# Patient Record
Sex: Male | Born: 1965 | Race: Black or African American | Hispanic: No | Marital: Married | Smoking: Current every day smoker
Health system: Southern US, Community
[De-identification: ages and names within clinical notes are randomized; demographics above are authoritative.]

## PROBLEM LIST (undated history)

## (undated) ENCOUNTER — Emergency Department (HOSPITAL_COMMUNITY): Payer: Self-pay

## (undated) DIAGNOSIS — D571 Sickle-cell disease without crisis: Secondary | ICD-10-CM

## (undated) DIAGNOSIS — A159 Respiratory tuberculosis unspecified: Secondary | ICD-10-CM

## (undated) DIAGNOSIS — D573 Sickle-cell trait: Secondary | ICD-10-CM

## (undated) HISTORY — PX: APPENDECTOMY: SHX54

## (undated) HISTORY — PX: MASS EXCISION: SHX2000

## (undated) HISTORY — DX: Sickle-cell disease without crisis: D57.1

## (undated) HISTORY — DX: Respiratory tuberculosis unspecified: A15.9

---

## 2006-03-19 ENCOUNTER — Ambulatory Visit (HOSPITAL_BASED_OUTPATIENT_CLINIC_OR_DEPARTMENT_OTHER): Admission: RE | Admit: 2006-03-19 | Discharge: 2006-03-19 | Payer: Self-pay | Admitting: Orthopedic Surgery

## 2013-09-08 ENCOUNTER — Encounter: Payer: Self-pay | Admitting: Family

## 2013-09-08 ENCOUNTER — Ambulatory Visit (INDEPENDENT_AMBULATORY_CARE_PROVIDER_SITE_OTHER): Payer: 59 | Admitting: Family

## 2013-09-08 ENCOUNTER — Encounter (INDEPENDENT_AMBULATORY_CARE_PROVIDER_SITE_OTHER): Payer: Self-pay

## 2013-09-08 VITALS — BP 123/77 | HR 85 | Temp 98.5°F | Ht 71.75 in | Wt 168.4 lb

## 2013-09-08 DIAGNOSIS — L02224 Furuncle of groin: Secondary | ICD-10-CM

## 2013-09-08 DIAGNOSIS — G930 Cerebral cysts: Secondary | ICD-10-CM

## 2013-09-08 DIAGNOSIS — L0232 Furuncle of buttock: Secondary | ICD-10-CM

## 2013-09-08 DIAGNOSIS — L02229 Furuncle of trunk, unspecified: Secondary | ICD-10-CM

## 2013-09-08 DIAGNOSIS — L02239 Carbuncle of trunk, unspecified: Secondary | ICD-10-CM

## 2013-09-08 DIAGNOSIS — L0233 Carbuncle of buttock: Secondary | ICD-10-CM

## 2013-09-08 MED ORDER — DOXYCYCLINE HYCLATE 100 MG PO TABS: 100.0000 mg | ORAL_TABLET | Freq: Two times a day (BID) | ORAL | Status: DC

## 2013-09-08 NOTE — Patient Instructions (Signed)

## 2013-09-08 NOTE — Progress Notes (Signed)
   Subjective:    Patient ID: Tyler Mora, male    DOB: [birth year], 48 y.o.   MRN: [id number]  HPI Pt presents to the office for cysts on forehead, right ear lobe, right thigh that pt noticed 2 years ago. Pt states the cysts on his face are not painful, but the one on his thigh is intermittently aching 5 out 10. Pt has not taken or tried anything for them.   Pt states he has multiple absesces On lower abdomen, groin, and gluteal folds. States he has had them 10 year on and off. States they come and go. The one on his lower abdomen is draining.   Review of Systems  Constitutional: Negative.   HENT: Negative.   Respiratory: Negative.   Cardiovascular: Negative.   Gastrointestinal: Negative.   Endocrine: Negative.   Genitourinary: Negative.   Musculoskeletal: Negative.   Neurological: Negative.   Hematological: Negative.   Psychiatric/Behavioral: Negative.   All other systems reviewed and are negative.      Objective:   Physical Exam  Vitals reviewed. Constitutional: He is oriented to person, place, and time. He appears well-developed and well-nourished. No distress.  HENT:  Head: Normocephalic.  Right Ear: External ear normal.  Left Ear: External ear normal.  Mouth/Throat: Oropharynx is clear and moist.  Eyes: Pupils are equal, round, and reactive to light. Right eye exhibits no discharge. Left eye exhibits no discharge.  Neck: Normal range of motion. Neck supple. No thyromegaly present.  Cardiovascular: Normal rate, regular rhythm, normal heart sounds and intact distal pulses.   No murmur heard. Pulmonary/Chest: Effort normal and breath sounds normal. No respiratory distress. He has no wheezes.  Abdominal: Soft. Bowel sounds are normal. He exhibits no distension. There is no tenderness.  Musculoskeletal: Normal range of motion. He exhibits no edema and no tenderness.  Neurological: He is alert and oriented to person, place, and time. He has normal reflexes. No cranial nerve  deficit.  Skin: Skin is warm and dry. No rash noted. No erythema.  Psychiatric: He has a normal mood and affect. His behavior is normal. Judgment and thought content normal.   BP 123/77  Pulse 85  Temp(Src) 98.5 F (36.9 C) (Oral)  Ht 5' 11.75" (1.822 m)  Wt 168 lb 6.4 oz (76.386 kg)  BMI 23.01 kg/m2        Assessment & Plan:  1. Cerebral cysts - Ambulatory referral to Dermatology - doxycycline (VIBRA-TABS) 100 MG tablet; Take 1 tablet (100 mg total) by mouth 2 (two) times daily.  Dispense: 20 tablet; Refill: 0  2. Boil of buttock - Ambulatory referral to Dermatology - doxycycline (VIBRA-TABS) 100 MG tablet; Take 1 tablet (100 mg total) by mouth 2 (two) times daily.  Dispense: 20 tablet; Refill: 0  3. Boil, groin - doxycycline (VIBRA-TABS) 100 MG tablet; Take 1 tablet (100 mg total) by mouth 2 (two) times daily.  Dispense: 20 tablet; Refill: 0   Keep skin clean and dry Do not pick at abscesses RTO prn  Jannifer Rodneyhristy Sharlyn Odonnel, FNP

## 2013-10-19 ENCOUNTER — Encounter: Payer: Self-pay | Admitting: Family Medicine

## 2013-10-19 ENCOUNTER — Ambulatory Visit (INDEPENDENT_AMBULATORY_CARE_PROVIDER_SITE_OTHER): Payer: 59 | Admitting: Family Medicine

## 2013-10-19 ENCOUNTER — Encounter (INDEPENDENT_AMBULATORY_CARE_PROVIDER_SITE_OTHER): Payer: Self-pay

## 2013-10-19 VITALS — BP 130/77 | HR 78 | Temp 98.0°F | Ht 71.0 in | Wt 165.6 lb

## 2013-10-19 DIAGNOSIS — N6459 Other signs and symptoms in breast: Secondary | ICD-10-CM

## 2013-10-19 DIAGNOSIS — N6452 Nipple discharge: Secondary | ICD-10-CM

## 2013-10-19 DIAGNOSIS — Z0000 Encounter for general adult medical examination without abnormal findings: Secondary | ICD-10-CM

## 2013-10-19 LAB — POCT CBC
GRANULOCYTE PERCENT: 68 % (ref 37–80)
HEMATOCRIT: 39.4 % — AB (ref 43.5–53.7)
Hemoglobin: 13 g/dL — AB (ref 14.1–18.1)
Lymph, poc: 3.3 (ref 0.6–3.4)
MCH, POC: 32.2 pg — AB (ref 27–31.2)
MCHC: 32.9 g/dL (ref 31.8–35.4)
MCV: 97.8 fL — AB (ref 80–97)
MPV: 8.2 fL (ref 0–99.8)
POC Granulocyte: 7.8 — AB (ref 2–6.9)
POC LYMPH PERCENT: 29.2 %L (ref 10–50)
Platelet Count, POC: 224 10*3/uL (ref 142–424)
RBC: 4 M/uL — AB (ref 4.69–6.13)
RDW, POC: 12.7 %
WBC: 11.4 10*3/uL — AB (ref 4.6–10.2)

## 2013-10-19 NOTE — Progress Notes (Signed)
   Subjective:    Patient ID: Tyler Mora, male    DOB: April 14, 1965, 48 y.o.   MRN: [id number]  HPI 48 year old gentleman who is here for a physical. He has had some problems with recurrent boils and was supposed to have seen a dermatologist but he canceled that appointment. He was placed on antibiotic his last visit here he does not feel that is effective. He also talks about some sort of mass under left breast area for which he had surgery. Problems seemed to resolve but now is recurring there is some discharge from the breast as it enlarges and then with discharge had become smaller.    Review of Systems  Constitutional: Negative.   HENT: Negative.   Eyes: Negative.   Respiratory: Negative.  Negative for shortness of breath.   Cardiovascular: Negative.  Negative for chest pain and leg swelling.  Gastrointestinal: Negative.   Genitourinary: Negative.   Musculoskeletal: Negative.   Skin: Negative.   Neurological: Negative.   Psychiatric/Behavioral: Negative.   All other systems reviewed and are negative.      Objective:   Physical Exam  Constitutional: He is oriented to person, place, and time. He appears well-developed and well-nourished.  HENT:  Head: Normocephalic.  Right Ear: External ear normal.  Left Ear: External ear normal.  Nose: Nose normal.  Mouth/Throat: Oropharynx is clear and moist.  Eyes: Conjunctivae and EOM are normal. Pupils are equal, round, and reactive to light.  Neck: Normal range of motion. Neck supple.  Cardiovascular: Normal rate, regular rhythm, normal heart sounds and intact distal pulses.   Pulmonary/Chest: Effort normal and breath sounds normal.  I can palpate no specific mass under the or behind the left breast and there is no discharge  Abdominal: Soft. Bowel sounds are normal.  Genitourinary: Prostate normal.  Musculoskeletal: Normal range of motion.  Neurological: He is alert and oriented to person, place, and time.  Skin: Skin is warm and  dry.  Several small lumps at the superior aspect of the gluteal fold nontender nondraining that he has described as boils.  Psychiatric: He has a normal mood and affect. His behavior is normal. Judgment and thought content normal.   BP 130/77  Pulse 78  Temp(Src) 98 F (36.7 C) (Oral)  Ht _0  (1.803 m)  Wt 165 lb 9.6 oz (75.116 kg)  BMI 23.11 kg/m2       Assessment & Plan:  1. Routine general medical examination at a health care facility  - POCT CBC - CMP14+EGFR - Lipid panel  2. Breast discharge Send back to surgeon who operated initially - Prolactin - Ambulatory referral to General Surgery  Wardell Honour MD

## 2013-10-19 NOTE — Patient Instructions (Signed)

## 2013-10-20 LAB — CMP14+EGFR
ALBUMIN: 4.4 g/dL (ref 3.5–5.5)
ALK PHOS: 70 IU/L (ref 39–117)
ALT: 15 IU/L (ref 0–44)
AST: 16 IU/L (ref 0–40)
Albumin/Globulin Ratio: 1.4 (ref 1.1–2.5)
BILIRUBIN TOTAL: 0.5 mg/dL (ref 0.0–1.2)
BUN / CREAT RATIO: 10 (ref 9–20)
BUN: 9 mg/dL (ref 6–24)
CO2: 24 mmol/L (ref 18–29)
CREATININE: 0.88 mg/dL (ref 0.76–1.27)
Calcium: 9.4 mg/dL (ref 8.7–10.2)
Chloride: 101 mmol/L (ref 97–108)
GFR calc non Af Amer: 102 mL/min/{1.73_m2} (ref >59)
GFR, EST AFRICAN AMERICAN: 118 mL/min/{1.73_m2} (ref >59)
Globulin, Total: 3.1 g/dL (ref 1.5–4.5)
Glucose: 84 mg/dL (ref 65–99)
Potassium: 4.1 mmol/L (ref 3.5–5.2)
Sodium: 139 mmol/L (ref 134–144)
TOTAL PROTEIN: 7.5 g/dL (ref 6.0–8.5)

## 2013-10-20 LAB — LIPID PANEL
CHOL/HDL RATIO: 3.4 ratio (ref 0.0–5.0)
Cholesterol, Total: 181 mg/dL (ref 100–199)
HDL: 53 mg/dL (ref >39)
LDL Calculated: 104 mg/dL — ABNORMAL HIGH (ref 0–99)
Triglycerides: 122 mg/dL (ref 0–149)
VLDL CHOLESTEROL CAL: 24 mg/dL (ref 5–40)

## 2013-10-20 LAB — PROLACTIN: Prolactin: 2.9 ng/mL — ABNORMAL LOW (ref 4.0–15.2)

## 2013-11-18 ENCOUNTER — Telehealth: Payer: Self-pay | Admitting: Family Medicine

## 2013-11-18 NOTE — Telephone Encounter (Signed)
Re-Informed patient of lab results and Dr. Rondel BatonMiller's notes from 9/16 labs

## 2018-08-19 ENCOUNTER — Emergency Department (HOSPITAL_COMMUNITY)
Admission: EM | Admit: 2018-08-19 | Discharge: 2018-08-19 | Disposition: A | Discharge status: Home / Self Care | Payer: BC Managed Care – PPO | Attending: Emergency Medicine | Admitting: Emergency Medicine

## 2018-08-19 ENCOUNTER — Other Ambulatory Visit: Payer: Self-pay

## 2018-08-19 ENCOUNTER — Encounter (HOSPITAL_COMMUNITY): Payer: Self-pay

## 2018-08-19 DIAGNOSIS — L0291 Cutaneous abscess, unspecified: Secondary | ICD-10-CM | POA: Diagnosis present

## 2018-08-19 DIAGNOSIS — F17200 Nicotine dependence, unspecified, uncomplicated: Secondary | ICD-10-CM | POA: Diagnosis not present

## 2018-08-19 DIAGNOSIS — L02412 Cutaneous abscess of left axilla: Secondary | ICD-10-CM | POA: Diagnosis not present

## 2018-08-19 MED ORDER — SULFAMETHOXAZOLE-TRIMETHOPRIM 800-160 MG PO TABS
1.0000 | ORAL_TABLET | Freq: Once | ORAL | Status: AC
  Administered 2018-08-19: 1 via ORAL
  Filled 2018-08-19: qty 1

## 2018-08-19 MED ORDER — HYDROCODONE-ACETAMINOPHEN 5-325 MG PO TABS: 1.0000 | ORAL_TABLET | ORAL | 0 refills | Status: DC | PRN

## 2018-08-19 MED ORDER — LIDOCAINE HCL (PF) 1 % IJ SOLN
INTRAMUSCULAR | Status: AC
  Administered 2018-08-19: 30 mL
  Filled 2018-08-19: qty 30

## 2018-08-19 MED ORDER — SULFAMETHOXAZOLE-TRIMETHOPRIM 800-160 MG PO TABS: 1.0000 | ORAL_TABLET | Freq: Two times a day (BID) | ORAL | 0 refills | Status: AC

## 2018-08-19 MED ORDER — POVIDONE-IODINE 10 % EX SOLN
CUTANEOUS | Status: AC
  Filled 2018-08-19: qty 15

## 2018-08-19 MED ORDER — HYDROCODONE-ACETAMINOPHEN 5-325 MG PO TABS
1.0000 | ORAL_TABLET | Freq: Once | ORAL | Status: AC
  Administered 2018-08-19: 1 via ORAL
  Filled 2018-08-19: qty 1

## 2018-08-19 NOTE — ED Triage Notes (Signed)
Pt has abscess to left underarm that has been developing for 2 weeks. Has tried warm compresses

## 2018-08-19 NOTE — ED Provider Notes (Signed)
[facility] Provider Note   CSN: [id number] Arrival date & time: 08/19/18  0740     History   Chief Complaint Chief Complaint  Patient presents with  . Abscess    HPI Tyler Mora is a 53 y.o. male.     The history is provided by the patient.  Abscess Location:  Shoulder/arm Shoulder/arm abscess location:  L axilla Abscess quality: fluctuance, painful and redness   Red streaking: no   Duration:  2 weeks Progression:  Worsening Pain details:    Quality:  Sharp and throbbing   Severity:  Severe   Timing:  Constant   Progression:  Worsening Chronicity:  Recurrent Context: not diabetes, not immunosuppression, not injected drug use and not skin injury   Relieved by: He has applied Aspercreme to the site with minimal improvement. Worsened by:  Nothing Ineffective treatments:  Warm compresses Associated symptoms: no fever, no nausea and no vomiting   Risk factors: prior abscess   Risk factors comment:  Has had infrequent abscesses, 1 to left nipple another to the buttocks.   Past Medical History:  Diagnosis Date  . Sickle cell anemia (HCC)    Trait  . Tuberculosis    Pt reports being exposed    There are no active problems to display for this patient.   Past Surgical History:  Procedure Laterality Date  . APPENDECTOMY    . MASS EXCISION Left    Chest area        Home Medications    Prior to Admission medications   Medication Sig Start Date End Date Taking? Authorizing Provider  HYDROcodone-acetaminophen (NORCO/VICODIN) 5-325 MG tablet Take 1 tablet by mouth every 4 (four) hours as needed. 08/19/18   Burgess AmorIdol, Sheryn Aldaz, PA-C  sulfamethoxazole-trimethoprim (BACTRIM DS) 800-160 MG tablet Take 1 tablet by mouth 2 (two) times daily for 10 days. 08/19/18 08/29/18  Burgess AmorIdol, Louise Victory, PA-C    Family History No family history on file.  Social History Social History   Tobacco Use  . Smoking status: Current Every Day Smoker  . Smokeless tobacco:  Never Used  Substance Use Topics  . Alcohol use: Yes  . Drug use: No     Allergies   Iodine and Penicillins   Review of Systems Review of Systems  Constitutional: Negative for chills and fever.  Respiratory: Negative for shortness of breath and wheezing.   Gastrointestinal: Negative for nausea and vomiting.  Skin: Positive for color change.       Negative except as mentioned in HPI.   Neurological: Negative for numbness.     Physical Exam Updated Vital Signs BP 133/78 (BP Location: Right Arm)   Pulse 76   Temp 98.1 F (36.7 C) (Oral)   Resp 14   Ht 6' (1.829 m)   Wt 83 kg   SpO2 99%   BMI 24.82 kg/m   Physical Exam Constitutional:      General: He is not in acute distress.    Appearance: He is well-developed.  HENT:     Head: Normocephalic.  Neck:     Musculoskeletal: Neck supple.  Cardiovascular:     Rate and Rhythm: Normal rate.  Pulmonary:     Effort: Pulmonary effort is normal.     Breath sounds: No wheezing.  Musculoskeletal: Normal range of motion.  Skin:    Findings: Abscess present. No rash.     Comments: Large golf ball sized fluctuant raised abscess mid left axilla.  There is no spontaneous drainage.  No  red streaking or surrounding erythema.      ED Treatments / Results  Labs (all labs ordered are listed, but only abnormal results are displayed) Labs Reviewed  AEROBIC CULTURE (SUPERFICIAL SPECIMEN)    EKG None  Radiology No results found.  Procedures Procedures (including critical care time)  INCISION AND DRAINAGE Performed by: Evalee Jefferson Consent: Verbal consent obtained. Risks and benefits: risks, benefits and alternatives were discussed Type: abscess  Body area: Left axilla  Anesthesia: local infiltration  Incision was made with a scalpel.  Local anesthetic: lidocaine 1 % without epinephrine  Anesthetic total: 4 ml  Complexity: complex Blunt dissection to break up loculations  Drainage: purulent  Drainage  amount: Copious  Packing material: 1/4 in iodoform gauze  Patient tolerance: Patient tolerated the procedure well with no immediate complications.     Medications Ordered in ED Medications  lidocaine (PF) (XYLOCAINE) 1 % injection (has no administration in time range)  sulfamethoxazole-trimethoprim (BACTRIM DS) 800-160 MG per tablet 1 tablet (has no administration in time range)  HYDROcodone-acetaminophen (NORCO/VICODIN) 5-325 MG per tablet 1 tablet (has no administration in time range)     Initial Impression / Assessment and Plan / ED Course  I have reviewed the triage vital signs and the nursing notes.  Pertinent labs & imaging results that were available during my care of the patient were reviewed by me and considered in my medical decision making (see chart for details).        Abscess culture was obtained.  He was placed on Bactrim, hydrocodone for pain.  Discussed home care including warm water soaks, gentle massage, patient felt comfortable removing the packing in 3 days, amount of packing was approximately 3 inches, just enough to keep the abscess site open.  Discussed return precautions.  Final Clinical Impressions(s) / ED Diagnoses   Final diagnoses:  Abscess of left axilla    ED Discharge Orders         Ordered    HYDROcodone-acetaminophen (NORCO/VICODIN) 5-325 MG tablet  Every 4 hours PRN     08/19/18 1009    sulfamethoxazole-trimethoprim (BACTRIM DS) 800-160 MG tablet  2 times daily     08/19/18 1009           Evalee Jefferson, Hershal Coria 08/19/18 1035    Milton Ferguson, MD 08/21/18 [phone]

## 2018-08-19 NOTE — Discharge Instructions (Addendum)
Apply a warm soak or gentle shower massage to your abscess site twice daily as discussed, gently massaging around the site while in the shower can help facilitate continued drainage.  Pull the packing out (if it has not already fallen out) in 3 days.  Take your next dose of the antibiotic tonight.  You may take the hydrocodone prescribed for pain relief.  This will make you drowsy - do not drive within 4 hours of taking this medication.  Get rechecked if you have any problems as this infection continues to heal.

## 2018-08-22 LAB — AEROBIC CULTURE W GRAM STAIN (SUPERFICIAL SPECIMEN): Culture: NO GROWTH

## 2019-03-09 ENCOUNTER — Encounter (HOSPITAL_BASED_OUTPATIENT_CLINIC_OR_DEPARTMENT_OTHER): Payer: Self-pay

## 2019-03-09 ENCOUNTER — Other Ambulatory Visit: Payer: Self-pay

## 2019-03-09 ENCOUNTER — Emergency Department (HOSPITAL_BASED_OUTPATIENT_CLINIC_OR_DEPARTMENT_OTHER)
Admission: EM | Admit: 2019-03-09 | Discharge: 2019-03-09 | Disposition: A | Discharge status: Home / Self Care | Payer: BC Managed Care – PPO | Attending: Emergency Medicine | Admitting: Emergency Medicine

## 2019-03-09 DIAGNOSIS — Z880 Allergy status to penicillin: Secondary | ICD-10-CM | POA: Diagnosis not present

## 2019-03-09 DIAGNOSIS — L02811 Cutaneous abscess of head [any part, except face]: Secondary | ICD-10-CM | POA: Diagnosis not present

## 2019-03-09 DIAGNOSIS — R220 Localized swelling, mass and lump, head: Secondary | ICD-10-CM | POA: Diagnosis present

## 2019-03-09 DIAGNOSIS — Z883 Allergy status to other anti-infective agents status: Secondary | ICD-10-CM | POA: Insufficient documentation

## 2019-03-09 DIAGNOSIS — F17210 Nicotine dependence, cigarettes, uncomplicated: Secondary | ICD-10-CM | POA: Insufficient documentation

## 2019-03-09 HISTORY — DX: Sickle-cell trait: D57.3

## 2019-03-09 MED ORDER — DOXYCYCLINE HYCLATE 100 MG PO TABS
100.0000 mg | ORAL_TABLET | Freq: Once | ORAL | Status: AC
  Administered 2019-03-09: 23:00:00 100 mg via ORAL
  Filled 2019-03-09: qty 1

## 2019-03-09 MED ORDER — DOXYCYCLINE HYCLATE 100 MG PO CAPS: 100.0000 mg | ORAL_CAPSULE | Freq: Two times a day (BID) | ORAL | 0 refills | Status: DC

## 2019-03-09 NOTE — ED Triage Notes (Signed)
Pt c/o "boil or a cyst" to top of head x 2 days-NAD-steady gait

## 2019-03-09 NOTE — ED Provider Notes (Signed)
[facility] Provider Note: Georgena Spurling, MD, FACEP  CSN: [id number] MRN: [id number] ARRIVAL: 03/09/19 at 2244 ROOM: [place]  Abscess   HISTORY OF PRESENT ILLNESS  03/09/19 10:58 PM Tyler Mora is a 54 y.o. male who complains of a boil or cyst on the top of his head for 2 days.  Associated pain is rated as a 9 out of 10, worse with palpation.  He wears a helmet at work and this has been irritating the site.  It has not been draining.   Past Medical History:  Diagnosis Date  . Sickle cell trait ([place])   . Tuberculosis    Pt reports being exposed    Past Surgical History:  Procedure Laterality Date  . APPENDECTOMY    . MASS EXCISION Left    Chest area    No family history on file.  Social History   Tobacco Use  . Smoking status: Current Every Day Smoker  . Smokeless tobacco: Never Used  Substance Use Topics  . Alcohol use: Yes    Comment: occ  . Drug use: No    Prior to Admission medications   Medication Sig Start Date End Date Taking? Authorizing Provider  doxycycline (VIBRAMYCIN) 100 MG capsule Take 1 capsule (100 mg total) by mouth 2 (two) times daily. One po bid x 7 days 03/09/19   Dewaine Morocho, MD    Allergies Iodine and Penicillins   REVIEW OF SYSTEMS  Negative except as noted here or in the History of Present Illness.   PHYSICAL EXAMINATION  Initial Vital Signs Blood pressure (!) 147/101, pulse 87, temperature 98.4 F (36.9 C), temperature source Oral, resp. rate 18, height 6' (1.829 m), weight 78 kg, SpO2 100 %.  Examination General: Well-developed, well-nourished male in no acute distress; appearance consistent with age of record HENT: normocephalic; atraumatic; tender, nonfluctuant nodule right parietal scalp Eyes: Normal appearance Neck: supple Heart: regular rate and rhythm Lungs: clear to auscultation bilaterally Abdomen: soft; nondistended; nontender; bowel sounds present Extremities: No deformity; full  range of motion Neurologic: Awake, alert and oriented; motor function intact in all extremities and symmetric; no facial droop Skin: Warm and dry Psychiatric: Normal mood and affect   RESULTS  Summary of this visit's results, reviewed and interpreted by myself:   EKG Interpretation  Date/Time:    Ventricular Rate:    PR Interval:    QRS Duration:   QT Interval:    QTC Calculation:   R Axis:     Text Interpretation:        Laboratory Studies: No results found for this or any previous visit (from the past 24 hour(s)). Imaging Studies: No results found.  ED COURSE and MDM  Nursing notes, initial and subsequent vitals signs, including pulse oximetry, reviewed and interpreted by myself.  Vitals:   03/09/19 2250  BP: (!) 147/101  Pulse: 87  Resp: 18  Temp: 98.4 F (36.9 C)  TempSrc: Oral  SpO2: 100%  Weight: 78 kg  Height: 6' (1.829 m)   Medications  doxycycline (VIBRA-TABS) tablet 100 mg (has no administration in time range)    No pus pocket seen with bedside ultrasound.  We will trial a course of doxycycline and have the patient return in 2 to 3 days if symptoms are worsening we will consider I&D.  Examination consistent with an early abscess.  It could also represent an inflamed or infected epidermoid cyst but the rapidity of onset suggests against this.  PROCEDURES  Procedures   ED DIAGNOSES     ICD-10-CM   1. Abscess of scalp  L02.811        Paula Libra, MD 03/09/19 2309

## 2019-09-15 ENCOUNTER — Ambulatory Visit: Payer: BC Managed Care – PPO | Attending: Internal Medicine

## 2019-09-15 DIAGNOSIS — Z23 Encounter for immunization: Secondary | ICD-10-CM

## 2019-09-15 NOTE — Progress Notes (Signed)
   Covid-19 Vaccination Clinic  Name:  Tyler Mora    MRN: [id number] DOB: [birth year]  09/15/2019  [name] was observed post Covid-19 immunization for 15 minutes without incident. He was provided with Vaccine Information Sheet and instruction to access the V-Safe system.   [name] was instructed to call 911 with any severe reactions post vaccine: Marland Kitchen Difficulty breathing  . Swelling of face and throat  . A fast heartbeat  . A bad rash all over body  . Dizziness and weakness   Immunizations Administered    Name Date Dose VIS Date Route   Pfizer COVID-19 Vaccine 09/15/2019  3:18 PM 0.3 mL 03/30/2018 Intramuscular   Manufacturer: [employer], [employer]   Lot: [id number]   NDC: [address]-5    .

## 2019-10-06 ENCOUNTER — Ambulatory Visit: Payer: BC Managed Care – PPO | Attending: Internal Medicine

## 2019-10-06 ENCOUNTER — Other Ambulatory Visit: Payer: Self-pay

## 2019-10-06 DIAGNOSIS — Z23 Encounter for immunization: Secondary | ICD-10-CM

## 2019-10-06 NOTE — Progress Notes (Signed)
   Covid-19 Vaccination Clinic  Name:  Mando Blatz    MRN: [id number] DOB: [birth year]  10/06/2019  Mr. Hofacker was observed post Covid-19 immunization for 15 minutes without incident. He was provided with Vaccine Information Sheet and instruction to access the V-Safe system.   Mr. Swiatek was instructed to call 911 with any severe reactions post vaccine: Marland Kitchen Difficulty breathing  . Swelling of face and throat  . A fast heartbeat  . A bad rash all over body  . Dizziness and weakness   Immunizations Administered    Name Date Dose VIS Date Route   Pfizer COVID-19 Vaccine 10/06/2019  3:14 PM 0.3 mL 03/30/2018 Intramuscular   Manufacturer: [employer], [employer]   Lot: [id number]   NDC: [address]-9

## 2021-02-22 ENCOUNTER — Emergency Department (HOSPITAL_COMMUNITY): Payer: 59

## 2021-02-22 ENCOUNTER — Inpatient Hospital Stay (HOSPITAL_COMMUNITY): Payer: 59 | Admitting: Anesthesiology

## 2021-02-22 ENCOUNTER — Inpatient Hospital Stay (HOSPITAL_COMMUNITY): Payer: 59

## 2021-02-22 ENCOUNTER — Encounter (HOSPITAL_COMMUNITY)
Admission: EM | Disposition: A | Discharge status: Home / Self Care | Payer: Self-pay | Source: Home / Self Care | Attending: Neurosurgery

## 2021-02-22 ENCOUNTER — Inpatient Hospital Stay (HOSPITAL_COMMUNITY)
Admission: EM | Admit: 2021-02-22 | Discharge: 2021-02-26 | DRG: 022 | Disposition: A | Discharge status: Home / Self Care | Payer: 59 | Attending: Neurosurgery | Admitting: Neurosurgery

## 2021-02-22 ENCOUNTER — Encounter (HOSPITAL_COMMUNITY): Payer: Self-pay | Admitting: *Deleted

## 2021-02-22 ENCOUNTER — Other Ambulatory Visit: Payer: Self-pay

## 2021-02-22 DIAGNOSIS — Z880 Allergy status to penicillin: Secondary | ICD-10-CM | POA: Diagnosis not present

## 2021-02-22 DIAGNOSIS — I615 Nontraumatic intracerebral hemorrhage, intraventricular: Principal | ICD-10-CM | POA: Diagnosis present

## 2021-02-22 DIAGNOSIS — I609 Nontraumatic subarachnoid hemorrhage, unspecified: Secondary | ICD-10-CM

## 2021-02-22 DIAGNOSIS — I725 Aneurysm of other precerebral arteries: Secondary | ICD-10-CM | POA: Diagnosis present

## 2021-02-22 DIAGNOSIS — R519 Headache, unspecified: Secondary | ICD-10-CM

## 2021-02-22 DIAGNOSIS — F17210 Nicotine dependence, cigarettes, uncomplicated: Secondary | ICD-10-CM | POA: Diagnosis present

## 2021-02-22 DIAGNOSIS — I671 Cerebral aneurysm, nonruptured: Secondary | ICD-10-CM | POA: Diagnosis present

## 2021-02-22 DIAGNOSIS — Z888 Allergy status to other drugs, medicaments and biological substances status: Secondary | ICD-10-CM

## 2021-02-22 DIAGNOSIS — D573 Sickle-cell trait: Secondary | ICD-10-CM | POA: Diagnosis present

## 2021-02-22 DIAGNOSIS — I604 Nontraumatic subarachnoid hemorrhage from basilar artery: Secondary | ICD-10-CM | POA: Diagnosis present

## 2021-02-22 DIAGNOSIS — Z20822 Contact with and (suspected) exposure to covid-19: Secondary | ICD-10-CM | POA: Diagnosis present

## 2021-02-22 HISTORY — PX: RADIOLOGY WITH ANESTHESIA: SHX6223

## 2021-02-22 HISTORY — PX: IR ANGIO INTRA EXTRACRAN SEL INTERNAL CAROTID BILAT MOD SED: IMG5363

## 2021-02-22 HISTORY — PX: IR ANGIOGRAM FOLLOW UP STUDY: IMG697

## 2021-02-22 HISTORY — PX: IR TRANSCATH/EMBOLIZ: IMG695

## 2021-02-22 LAB — DIFFERENTIAL
Abs Immature Granulocytes: 0.06 10*3/uL (ref 0.00–0.07)
Basophils Absolute: 0.1 10*3/uL (ref 0.0–0.1)
Basophils Relative: 0 %
Eosinophils Absolute: 0.4 10*3/uL (ref 0.0–0.5)
Eosinophils Relative: 2 %
Immature Granulocytes: 0 %
Lymphocytes Relative: 23 %
Lymphs Abs: 3.7 10*3/uL (ref 0.7–4.0)
Monocytes Absolute: 1.2 10*3/uL — ABNORMAL HIGH (ref 0.1–1.0)
Monocytes Relative: 7 %
Neutro Abs: 10.9 10*3/uL — ABNORMAL HIGH (ref 1.7–7.7)
Neutrophils Relative %: 68 %

## 2021-02-22 LAB — CBC
HCT: 43 % (ref 39.0–52.0)
Hemoglobin: 14.7 g/dL (ref 13.0–17.0)
MCH: 33.4 pg (ref 26.0–34.0)
MCHC: 34.2 g/dL (ref 30.0–36.0)
MCV: 97.7 fL (ref 80.0–100.0)
Platelets: 264 10*3/uL (ref 150–400)
RBC: 4.4 MIL/uL (ref 4.22–5.81)
RDW: 12.7 % (ref 11.5–15.5)
WBC: 16.4 10*3/uL — ABNORMAL HIGH (ref 4.0–10.5)
nRBC: 0 % (ref 0.0–0.2)

## 2021-02-22 LAB — COMPREHENSIVE METABOLIC PANEL
ALT: 19 U/L (ref 0–44)
AST: 31 U/L (ref 15–41)
Albumin: 4.7 g/dL (ref 3.5–5.0)
Alkaline Phosphatase: 81 U/L (ref 38–126)
Anion gap: 11 (ref 5–15)
BUN: 13 mg/dL (ref 6–20)
CO2: 25 mmol/L (ref 22–32)
Calcium: 9.2 mg/dL (ref 8.9–10.3)
Chloride: 102 mmol/L (ref 98–111)
Creatinine, Ser: 0.93 mg/dL (ref 0.61–1.24)
GFR, Estimated: >60 mL/min (ref >60)
Glucose, Bld: 116 mg/dL — ABNORMAL HIGH (ref 70–99)
Potassium: 4 mmol/L (ref 3.5–5.1)
Sodium: 138 mmol/L (ref 135–145)
Total Bilirubin: 0.6 mg/dL (ref 0.3–1.2)
Total Protein: 8.9 g/dL — ABNORMAL HIGH (ref 6.5–8.1)

## 2021-02-22 LAB — TYPE AND SCREEN
ABO/RH(D): AB POS
Antibody Screen: NEGATIVE

## 2021-02-22 LAB — RAPID URINE DRUG SCREEN, HOSP PERFORMED
Amphetamines: NOT DETECTED
Barbiturates: NOT DETECTED
Benzodiazepines: NOT DETECTED
Cocaine: NOT DETECTED
Opiates: NOT DETECTED
Tetrahydrocannabinol: NOT DETECTED

## 2021-02-22 LAB — URINALYSIS, ROUTINE W REFLEX MICROSCOPIC
Bilirubin Urine: NEGATIVE
Glucose, UA: NEGATIVE mg/dL
Hgb urine dipstick: NEGATIVE
Ketones, ur: NEGATIVE mg/dL
Leukocytes,Ua: NEGATIVE
Nitrite: NEGATIVE
Protein, ur: NEGATIVE mg/dL
Specific Gravity, Urine: 1.031 — ABNORMAL HIGH (ref 1.005–1.030)
pH: 6 (ref 5.0–8.0)

## 2021-02-22 LAB — RESP PANEL BY RT-PCR (FLU A&B, COVID) ARPGX2
Influenza A by PCR: NEGATIVE
Influenza B by PCR: NEGATIVE
SARS Coronavirus 2 by RT PCR: NEGATIVE

## 2021-02-22 LAB — ABO/RH: ABO/RH(D): AB POS

## 2021-02-22 LAB — APTT: aPTT: 23 seconds — ABNORMAL LOW (ref 24–36)

## 2021-02-22 LAB — PROTIME-INR
INR: 0.9 (ref 0.8–1.2)
Prothrombin Time: 11.9 seconds (ref 11.4–15.2)

## 2021-02-22 LAB — TROPONIN I (HIGH SENSITIVITY)
Troponin I (High Sensitivity): 4 ng/L (ref <18)
Troponin I (High Sensitivity): 3 ng/L (ref <18)

## 2021-02-22 LAB — ETHANOL: Alcohol, Ethyl (B): <10 mg/dL (ref <10)

## 2021-02-22 SURGERY — IR WITH ANESTHESIA
Anesthesia: General

## 2021-02-22 MED ORDER — STROKE: EARLY STAGES OF RECOVERY BOOK
Freq: Once | Status: AC
  Filled 2021-02-22: qty 1

## 2021-02-22 MED ORDER — CHLORHEXIDINE GLUCONATE 0.12 % MT SOLN
OROMUCOSAL | Status: AC
  Filled 2021-02-22: qty 15

## 2021-02-22 MED ORDER — MORPHINE SULFATE (PF) 2 MG/ML IV SOLN: 1.0000 mg | INTRAVENOUS | Status: DC | PRN

## 2021-02-22 MED ORDER — METOCLOPRAMIDE HCL 5 MG/ML IJ SOLN
10.0000 mg | Freq: Once | INTRAMUSCULAR | Status: AC
  Administered 2021-02-22: 10 mg via INTRAVENOUS
  Filled 2021-02-22: qty 2

## 2021-02-22 MED ORDER — ONDANSETRON HCL 4 MG/2ML IJ SOLN
INTRAMUSCULAR | Status: DC | PRN
Start: 2021-02-22 — End: 2021-02-22
  Administered 2021-02-22: 4 mg via INTRAVENOUS

## 2021-02-22 MED ORDER — ORAL CARE MOUTH RINSE: 15.0000 mL | Freq: Once | OROMUCOSAL | Status: DC

## 2021-02-22 MED ORDER — SODIUM CHLORIDE 0.9 % IV SOLN: INTRAVENOUS | Status: DC

## 2021-02-22 MED ORDER — CLEVIDIPINE BUTYRATE 0.5 MG/ML IV EMUL
0.0000 mg/h | INTRAVENOUS | Status: DC
  Administered 2021-02-22: 2 mg/h via INTRAVENOUS
  Filled 2021-02-22: qty 50

## 2021-02-22 MED ORDER — SUGAMMADEX SODIUM 200 MG/2ML IV SOLN
INTRAVENOUS | Status: DC | PRN
  Administered 2021-02-22: 300 mg via INTRAVENOUS

## 2021-02-22 MED ORDER — NIMODIPINE 6 MG/ML PO SOLN
60.0000 mg | ORAL | Status: DC
  Administered 2021-02-25: 60 mg
  Filled 2021-02-22: qty 10

## 2021-02-22 MED ORDER — CLEVIDIPINE BUTYRATE 0.5 MG/ML IV EMUL
0.0000 mg/h | INTRAVENOUS | Status: DC
  Administered 2021-02-22: 2 mg/h via INTRAVENOUS
  Filled 2021-02-22 (×2): qty 50

## 2021-02-22 MED ORDER — ACETAMINOPHEN 160 MG/5ML PO SOLN: 650.0000 mg | ORAL | Status: DC | PRN

## 2021-02-22 MED ORDER — ACETAMINOPHEN 650 MG RE SUPP: 650.0000 mg | RECTAL | Status: DC | PRN

## 2021-02-22 MED ORDER — DIPHENHYDRAMINE HCL 50 MG/ML IJ SOLN
25.0000 mg | Freq: Once | INTRAMUSCULAR | Status: AC
  Administered 2021-02-22: 25 mg via INTRAVENOUS
  Filled 2021-02-22: qty 1

## 2021-02-22 MED ORDER — IOHEXOL 350 MG/ML SOLN
75.0000 mL | Freq: Once | INTRAVENOUS | Status: AC | PRN
  Administered 2021-02-22: 75 mL via INTRAVENOUS

## 2021-02-22 MED ORDER — FENTANYL CITRATE (PF) 100 MCG/2ML IJ SOLN
INTRAMUSCULAR | Status: AC
  Filled 2021-02-22: qty 2

## 2021-02-22 MED ORDER — LACTATED RINGERS IV SOLN: INTRAVENOUS | Status: DC

## 2021-02-22 MED ORDER — SODIUM CHLORIDE 0.9 % IV SOLN
INTRAVENOUS | Status: DC | PRN
Start: 2021-02-22 — End: 2021-02-22

## 2021-02-22 MED ORDER — EPHEDRINE SULFATE (PRESSORS) 50 MG/ML IJ SOLN
INTRAMUSCULAR | Status: DC | PRN
Start: 2021-02-22 — End: 2021-02-22
  Administered 2021-02-22 (×2): 5 mg via INTRAVENOUS

## 2021-02-22 MED ORDER — PHENYLEPHRINE HCL-NACL 20-0.9 MG/250ML-% IV SOLN
INTRAVENOUS | Status: DC | PRN
  Administered 2021-02-22: 30 ug/min via INTRAVENOUS

## 2021-02-22 MED ORDER — FENTANYL CITRATE (PF) 250 MCG/5ML IJ SOLN
INTRAMUSCULAR | Status: DC | PRN
  Administered 2021-02-22: 100 ug via INTRAVENOUS

## 2021-02-22 MED ORDER — CHLORHEXIDINE GLUCONATE 0.12 % MT SOLN: 15.0000 mL | Freq: Once | OROMUCOSAL | Status: DC

## 2021-02-22 MED ORDER — LABETALOL HCL 5 MG/ML IV SOLN: 20.0000 mg | Freq: Once | INTRAVENOUS | Status: DC

## 2021-02-22 MED ORDER — ONDANSETRON 4 MG PO TBDP: 4.0000 mg | ORAL_TABLET | Freq: Four times a day (QID) | ORAL | Status: DC | PRN

## 2021-02-22 MED ORDER — ONDANSETRON HCL 4 MG/2ML IJ SOLN: 4.0000 mg | Freq: Four times a day (QID) | INTRAMUSCULAR | Status: DC | PRN

## 2021-02-22 MED ORDER — PANTOPRAZOLE 2 MG/ML SUSPENSION
40.0000 mg | Freq: Every day | ORAL | Status: DC
  Filled 2021-02-22: qty 20

## 2021-02-22 MED ORDER — GADOBUTROL 1 MMOL/ML IV SOLN
8.0000 mL | Freq: Once | INTRAVENOUS | Status: AC | PRN
  Administered 2021-02-22: 8 mL via INTRAVENOUS

## 2021-02-22 MED ORDER — CHLORHEXIDINE GLUCONATE CLOTH 2 % EX PADS
6.0000 | MEDICATED_PAD | Freq: Every day | CUTANEOUS | Status: DC
  Administered 2021-02-23 – 2021-02-26 (×3): 6 via TOPICAL

## 2021-02-22 MED ORDER — HYDROMORPHONE HCL 1 MG/ML IJ SOLN
1.0000 mg | Freq: Once | INTRAMUSCULAR | Status: AC
  Administered 2021-02-22: 1 mg via INTRAVENOUS
  Filled 2021-02-22: qty 1

## 2021-02-22 MED ORDER — LIDOCAINE 2% (20 MG/ML) 5 ML SYRINGE
INTRAMUSCULAR | Status: DC | PRN
  Administered 2021-02-22: 60 mg via INTRAVENOUS

## 2021-02-22 MED ORDER — LABETALOL HCL 5 MG/ML IV SOLN
20.0000 mg | Freq: Once | INTRAVENOUS | Status: AC
  Administered 2021-02-22: 20 mg via INTRAVENOUS
  Filled 2021-02-22: qty 4

## 2021-02-22 MED ORDER — DOCUSATE SODIUM 100 MG PO CAPS
100.0000 mg | ORAL_CAPSULE | Freq: Two times a day (BID) | ORAL | Status: DC
  Administered 2021-02-23 – 2021-02-26 (×2): 100 mg via ORAL
  Filled 2021-02-22 (×6): qty 1

## 2021-02-22 MED ORDER — NIMODIPINE 30 MG PO CAPS
60.0000 mg | ORAL_CAPSULE | ORAL | Status: DC
  Administered 2021-02-23 – 2021-02-26 (×22): 60 mg via ORAL
  Filled 2021-02-22 (×23): qty 2

## 2021-02-22 MED ORDER — ROCURONIUM BROMIDE 10 MG/ML (PF) SYRINGE
PREFILLED_SYRINGE | INTRAVENOUS | Status: DC | PRN
Start: 2021-02-22 — End: 2021-02-22
  Administered 2021-02-22: 60 mg via INTRAVENOUS
  Administered 2021-02-22: 40 mg via INTRAVENOUS

## 2021-02-22 MED ORDER — SODIUM CHLORIDE 0.9 % IV BOLUS
1000.0000 mL | Freq: Once | INTRAVENOUS | Status: AC
  Administered 2021-02-22: 1000 mL via INTRAVENOUS

## 2021-02-22 MED ORDER — DEXAMETHASONE SODIUM PHOSPHATE 10 MG/ML IJ SOLN
10.0000 mg | Freq: Once | INTRAMUSCULAR | Status: AC
  Administered 2021-02-22: 10 mg via INTRAVENOUS
  Filled 2021-02-22: qty 1

## 2021-02-22 MED ORDER — ACETAMINOPHEN-CODEINE #3 300-30 MG PO TABS
1.0000 | ORAL_TABLET | ORAL | Status: DC | PRN
  Administered 2021-02-24: 1 via ORAL
  Administered 2021-02-26: 07:00:00 2 via ORAL
  Filled 2021-02-22: qty 1
  Filled 2021-02-22: qty 2
  Filled 2021-02-22: qty 1

## 2021-02-22 MED ORDER — ACETAMINOPHEN 325 MG PO TABS
650.0000 mg | ORAL_TABLET | ORAL | Status: DC | PRN
  Administered 2021-02-25: 650 mg via ORAL
  Filled 2021-02-22 (×2): qty 2

## 2021-02-22 MED ORDER — LIDOCAINE HCL 1 % IJ SOLN
INTRAMUSCULAR | Status: AC
  Filled 2021-02-22: qty 20

## 2021-02-22 MED ORDER — PANTOPRAZOLE SODIUM 40 MG PO TBEC
40.0000 mg | DELAYED_RELEASE_TABLET | Freq: Every day | ORAL | Status: DC
  Administered 2021-02-23 – 2021-02-26 (×5): 40 mg via ORAL
  Filled 2021-02-22 (×5): qty 1

## 2021-02-22 MED ORDER — PROPOFOL 10 MG/ML IV BOLUS
INTRAVENOUS | Status: DC | PRN
  Administered 2021-02-22: 150 mg via INTRAVENOUS

## 2021-02-22 NOTE — ED Provider Notes (Addendum)
[facility] EMERGENCY DEPARTMENT Provider Note   CSN: [id number] Arrival date & time: 02/22/21  8676     History  Chief Complaint  Patient presents with   Headache    Tyler Mora is a 56 y.o. male.  Patient feeling fine yesterday.  Patient felt fine when he got awake this morning.  Then at 0 745 severe onset of neck pain that quickly moved up into his head.  Now his head hurts all over.  Neck now feels better.  Associated with nausea and vomiting x1 had some vision changes basically had vision loss in both eyes initially that lasted just for short period of time now has resolved.  Does have some photophobia.  Patient denies any respiratory symptoms any chest pain any shortness of breath any abdominal pain.  No upper extremity or lower extremity weakness or numbness.  And no trouble speaking.  In addition no back pain.  Patient does state that this happened 1 time before.  But he did not seek medical attention.  And it resolved on its own.  No history of migraines no family history of migraines.  Patient's past medical history is significant for no prior history of hypertension.  And a history of sickle cell trait.  Patient is an every day smoker.      Home Medications Prior to Admission medications   Medication Sig Start Date End Date Taking? Authorizing Provider  doxycycline (VIBRAMYCIN) 100 MG capsule Take 1 capsule (100 mg total) by mouth 2 (two) times daily. One po bid x 7 days Patient not taking: Reported on 02/22/2021 03/09/19   Molpus, John, MD      Allergies    Iodine and Penicillins    Review of Systems   Review of Systems  Constitutional:  Negative for chills and fever.  HENT:  Negative for ear pain and sore throat.   Eyes:  Positive for photophobia and visual disturbance. Negative for pain.  Respiratory:  Negative for cough and shortness of breath.   Cardiovascular:  Negative for chest pain and palpitations.  Gastrointestinal:  Negative for abdominal pain and  vomiting.  Genitourinary:  Negative for dysuria and hematuria.  Musculoskeletal:  Positive for neck pain. Negative for arthralgias and back pain.  Skin:  Negative for color change and rash.  Neurological:  Positive for headaches. Negative for seizures and syncope.  All other systems reviewed and are negative.  Physical Exam Updated Vital Signs BP (!) 183/98    Pulse 73    Temp 97.8 F (36.6 C) (Oral)    Resp (!) 23    Ht 1.829 m (6')    Wt 81.6 kg    SpO2 100%    BMI 24.41 kg/m  Physical Exam Vitals and nursing note reviewed.  Constitutional:      General: He is not in acute distress.    Appearance: Normal appearance. He is well-developed.  HENT:     Head: Normocephalic and atraumatic.  Eyes:     Extraocular Movements: Extraocular movements intact.     Conjunctiva/sclera: Conjunctivae normal.     Pupils: Pupils are equal, round, and reactive to light.  Cardiovascular:     Rate and Rhythm: Normal rate and regular rhythm.     Heart sounds: No murmur heard. Pulmonary:     Effort: Pulmonary effort is normal. No respiratory distress.     Breath sounds: Normal breath sounds.  Abdominal:     Palpations: Abdomen is soft.     Tenderness: There is no  abdominal tenderness.  Musculoskeletal:        General: No swelling.     Cervical back: Normal range of motion and neck supple. No rigidity.  Skin:    General: Skin is warm and dry.     Capillary Refill: Capillary refill takes less than 2 seconds.  Neurological:     General: No focal deficit present.     Mental Status: He is alert and oriented to person, place, and time.     Cranial Nerves: No cranial nerve deficit.     Sensory: No sensory deficit.     Motor: No weakness.     Coordination: Coordination normal.  Psychiatric:        Mood and Affect: Mood normal.    ED Results / Procedures / Treatments   Labs (all labs ordered are listed, but only abnormal results are displayed) Labs Reviewed  RESP PANEL BY RT-PCR (FLU A&B, COVID)  ARPGX2  ETHANOL  PROTIME-INR  APTT  CBC  DIFFERENTIAL  COMPREHENSIVE METABOLIC PANEL  RAPID URINE DRUG SCREEN, HOSP PERFORMED  URINALYSIS, ROUTINE W REFLEX MICROSCOPIC  I-STAT CHEM 8, ED    EKG None  Radiology No results found.  Procedures Procedures  Patient's blood pressure is elevated.  184/95 when he arrived.  Thinking this may be secondary to pain will reassess that as his migraine cocktail and pain medicine kicks in to see if that improves.  Medications Ordered in ED Medications  0.9 %  sodium chloride infusion (has no administration in time range)  sodium chloride 0.9 % bolus 1,000 mL (1,000 mLs Intravenous New Bag/Given 02/22/21 0959)  HYDROmorphone (DILAUDID) injection 1 mg (1 mg Intravenous Given 02/22/21 1001)  dexamethasone (DECADRON) injection 10 mg (10 mg Intravenous Given 02/22/21 1000)  metoCLOPramide (REGLAN) injection 10 mg (10 mg Intravenous Given 02/22/21 1000)  diphenhydrAMINE (BENADRYL) injection 25 mg (25 mg Intravenous Given 02/22/21 1000)    ED Course/ Medical Decision Making/ A&P                           Medical Decision Making Amount and/or Complexity of Data Reviewed Labs: ordered. Radiology: ordered.  Risk Prescription drug management. Decision regarding hospitalization.   CRITICAL CARE Performed by: Vanetta MuldersScott Janssen Zee Total critical care time: 95 minutes Critical care time was exclusive of separately billable procedures and treating other patients. Critical care was necessary to treat or prevent imminent or life-threatening deterioration. Critical care was time spent personally by me on the following activities: development of treatment plan with patient and/or surrogate as well as nursing, discussions with consultants, evaluation of patient's response to treatment, examination of patient, obtaining history from patient or surrogate, ordering and performing treatments and interventions, ordering and review of laboratory studies, ordering and  review of radiographic studies, pulse oximetry and re-evaluation of patient's condition.   Patient with acute onset of severe headache that actually started in the neck and then quickly went throughout all of the head.  Did have some temporary blindness in both eyes which has resolved.  Does have some photophobia.  Does have nausea and 1 episode of vomiting.  All suggestive of migraine-like headache but he does not have a history of migraines.  No neurodeficit.  Neck is no longer painful.  May be just a little bit of ache to it.  Will check labs.  Head CT will get CT neck.  Will give migraine cocktail to include Reglan Decadron Benadryl IV and IV fluids.  And also  will give 1 mg of hydromorphone.  Will reassess.  And will reassess his blood pressure.  Blood pressure currently is 172/88.  Patient states headache is improving but not resolved.  Received a call from radiology about the head CT.  They are seeing a hyperdense the within the third ventricle this may represent acute hemorrhage atypical shape for colloid cyst also possible.  There is early hydrocephalus.  Will discuss these findings with the telemetry neurologist.  Patient may need acute blood pressure control.  Discussed with neurology.  They agree with the MRI with and without to further delineate the findings in the third ventricle.  They are not convinced its blood.  They also do agree with blood pressure control to keep his systolic blood pressures below 150.  Currently patient's [id number] systolic.  Have ordered labetalol 20 mg IV.  Then to be followed up with the Cleviprex infusion.  MRI has been ordered.    Final Clinical Impression(s) / ED Diagnoses Final diagnoses:  Bad headache    Rx / DC Orders ED Discharge Orders     None         Fredia Sorrow, MD 02/22/21 1005    Fredia Sorrow, MD 02/22/21 1129    Fredia Sorrow, MD 02/22/21 1151  Addendum: Patient does show evidence of aneurysmal bleed in the third  ventricle area.  Also reviewed by the telemetry neurologist.  I have a call into neurosurgery.  Patient's blood pressures are better controlled on the Cleviprex.  Discussed with Dr. Christella Noa on for neurosurgery.  He would prefer proof of an aneurysm.  So order CT angio head and neck.  And that we will determine if there is any aneurysm that will be neurosurgery admission if there is not just subarachnoid hemorrhage then it would be the neuro hospitalist admission.  Patient will require ICU admission per tickly since he is on the Cleviprex drip.      Fredia Sorrow, MD 02/22/21 1334  Patient clinically still doing quite well.  Neurosurgery wants proof of the aneurysm if there is aneurysm then they will admit to ICU.  If that shows a subarachnoid bleed then patient will need to be admitted by the neurology service to ICU.  I have ordered CT angio head and neck.  Patient understands the reasons behind the additional studies.      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  We will recontact neurosurgery.  Patient remained stable.  Blood pressures currently [id number] systolic.  Patient overall feeling better.  Suspect patient will be an admission to neurosurgical service and an ICU admission at [facility].    Fredia Sorrow, MD 02/22/21 [phone]

## 2021-02-22 NOTE — Anesthesia Preprocedure Evaluation (Addendum)
Anesthesia Evaluation  Patient identified by MRN, date of birth, ID band Patient awake    Reviewed: Allergy & Precautions, Patient's Chart, lab work & pertinent test results  Airway Mallampati: II  TM Distance: >3 FB Neck ROM: Full    Dental  (+) Poor Dentition, Missing   Pulmonary Current Smoker,    Pulmonary exam normal        Cardiovascular negative cardio ROS   Rhythm:Regular Rate:Normal     Neuro/Psych SAH/Basilar aneurysm negative psych ROS   GI/Hepatic negative GI ROS, Neg liver ROS,   Endo/Other  negative endocrine ROS  Renal/GU negative Renal ROS  negative genitourinary   Musculoskeletal negative musculoskeletal ROS (+)   Abdominal Normal abdominal exam  (+)   Peds  Hematology negative hematology ROS (+) Sickle cell trait ,   Anesthesia Other Findings   Reproductive/Obstetrics                            Anesthesia Physical Anesthesia Plan  ASA: 4 and emergent  Anesthesia Plan: General   Post-op Pain Management: Minimal or no pain anticipated   Induction:   PONV Risk Score and Plan: Ondansetron, Dexamethasone, Treatment may vary due to age or medical condition and Midazolam  Airway Management Planned: Oral ETT and Mask  Additional Equipment: Arterial line  Intra-op Plan:   Post-operative Plan: Possible Post-op intubation/ventilation  Informed Consent: I have reviewed the patients History and Physical, chart, labs and discussed the procedure including the risks, benefits and alternatives for the proposed anesthesia with the patient or authorized representative who has indicated his/her understanding and acceptance.     Dental advisory given  Plan Discussed with: Anesthesiologist and CRNA  Anesthesia Plan Comments: (Lab Results      Component                Value               Date                      WBC                      16.4 (H)            02/22/2021                 HGB                      14.7                02/22/2021                HCT                      43.0                02/22/2021                MCV                      97.7                02/22/2021                PLT                      264  02/22/2021           Lab Results      Component                Value               Date                      NA                       138                 02/22/2021                K                        4.0                 02/22/2021                CO2                      25                  02/22/2021                GLUCOSE                  116 (H)             02/22/2021                BUN                      13                  02/22/2021                CREATININE               0.93                02/22/2021                CALCIUM                  9.2                 02/22/2021                GFRNONAA                 >60                 02/22/2021          )       Anesthesia Quick Evaluation

## 2021-02-22 NOTE — ED Notes (Signed)
Patient transported to CT 

## 2021-02-22 NOTE — ED Triage Notes (Signed)
Pt brought in by [facility] from home with c/o sudden onset of headache at 0745 this morning at the base of his head radiating around to the front. Pt had temporary loss of vision, became dizzy and fall, but did not have LOC or hit his head. Pt reports his vision is back to normal now. EMS reports BP of 200/104, HR 86. No medical history and takes no medications.

## 2021-02-22 NOTE — ED Notes (Signed)
Cleviprex drip initiated due to blood pressure 159/98

## 2021-02-22 NOTE — Transfer of Care (Signed)
Immediate Anesthesia Transfer of Care Note  Patient: Tyler Mora  Procedure(s) Performed: IR WITH ANESTHESIA  Patient Location: PACU  Anesthesia Type:General  Level of Consciousness: awake and drowsy  Airway & Oxygen Therapy: Patient Spontanous Breathing and Patient connected to face mask oxygen  Post-op Assessment: Report given to RN and Post -op Vital signs reviewed and stable  Post vital signs: Reviewed and stable  Last Vitals:  Vitals Value Taken Time  BP 120/70 02/22/21 2142  Temp    Pulse    Resp 14 02/22/21 2143  SpO2    Vitals shown include unvalidated device data.  Last Pain:  Vitals:   02/22/21 1645  TempSrc:   PainSc: Asleep         Complications: No notable events documented.

## 2021-02-22 NOTE — ED Notes (Signed)
Dr. Deretha Emory notified of pt's sudden onset of headache.

## 2021-02-22 NOTE — H&P (Addendum)
Chief Complaint   Chief Complaint  Patient presents with   Headache    History of Present Illness  Tyler Mora is a 56 y.o. male initially presenting to [facility] after sudden onset of severe headache earlier in the day.  Patient says he was watching TV and had sudden onset of primarily posterior headache and neck pain.  He walked over to his kitchen and thinks he actually blacked out for a moment.  He therefore called EMS and was taken to [facility] where CT scan revealed a small amount of ventricular hemorrhage.  He underwent further work-up including MRI scan confirming the small amount of intraventricular hemorrhage and the possibility of a basilar aneurysm.  Patient further underwent CT angiogram confirming the presence of the basilar aneurysm and he was transferred to [facility] for further care.  Currently, the patient is not complaining of any further headache.  No changes in his vision.  No numbness tingling or weakness of the extremities.  Of note, patient denies any history of hypertension, diabetes, heart disease, heart attack, or stroke.  No history of lung, liver, or kidney disease.  Patient does have a history of sickle cell trait.  He is not on any blood thinners or antiplatelet agents.  He is a smoker since the age of 74.  There is no known family history of intracranial aneurysms or unexplained intracranial hemorrhage.  Past Medical History   Past Medical History:  Diagnosis Date   Sickle cell trait (HCC)    Tuberculosis    Pt reports being exposed    Past Surgical History   Past Surgical History:  Procedure Laterality Date   APPENDECTOMY     MASS EXCISION Left    Chest area    Social History   Social History   Tobacco Use   Smoking status: Every Day    Packs/day: 0.25    Types: Cigarettes   Smokeless tobacco: Never  Vaping Use   Vaping Use: Never used  Substance Use Topics   Alcohol use: Yes    Comment: occ   Drug use: No     Medications   Prior to Admission medications   Medication Sig Start Date End Date Taking? Authorizing Provider  doxycycline (VIBRAMYCIN) 100 MG capsule Take 1 capsule (100 mg total) by mouth 2 (two) times daily. One po bid x 7 days Patient not taking: Reported on 02/22/2021 03/09/19   Molpus, John, MD    Allergies   Allergies  Allergen Reactions   Iodine Hives   Penicillins Hives    Review of Systems  ROS  Neurologic Exam  Awake, alert, oriented Memory and concentration grossly intact Speech fluent, appropriate CN grossly intact Motor exam: Upper Extremities Deltoid Bicep Tricep Grip  Right 5/5 5/5 5/5 5/5  Left 5/5 5/5 5/5 5/5   Lower Extremities IP Quad PF DF EHL  Right 5/5 5/5 5/5 5/5 5/5  Left 5/5 5/5 5/5 5/5 5/5   Sensation grossly intact to LT  Imaging  CT scan of the head was personally reviewed and demonstrates hemorrhage in the superior third ventricle at the level of the foramen of [place].  There is no identifiable subarachnoid hemorrhage.  MRI was also personally reviewed and again confirms the presence of third ventricular hemorrhage.  No subarachnoid hemorrhage.  There is suggestion of a basilar apex aneurysm on T2 weighted images.  CT angiogram was also reviewed and demonstrates a superiorly projecting aneurysm of the basilar apex measuring approximately 5 mm.  Vertebral arteries appear to be codominant.  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  Impression  - 56 y.o. 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  We have discussed the plan above including the details of the angiogram and coiling procedures.  We have also reviewed  the risks of the procedure to them include primarily stroke or further hemorrhage which could potentially lead to weakness, paralysis, coma, death.  We also reviewed risks of arterial dissection, contrast reaction, nephropathy, or groin hematoma.  We discussed the expected postoperative course and hospital stay associated with ruptured aneurysm.  All their questions were answered.  Both the patient and his wife are in agreement to proceed and [name] provided informed consent .  Excell Seltzer, MD [facility]

## 2021-02-22 NOTE — Brief Op Note (Signed)
°  NEUROSURGERY BRIEF OPERATIVE  NOTE   PREOP DX: Ruptured cerebral aneurysm  POSTOP DX: Same  PROCEDURE: Diagnostic cerebral angiogram  SURGEON: Dr. Consuella Lose, MD  ANESTHESIA: GETA  EBL: Minimal  SPECIMENS: None  COMPLICATIONS: None  CONDITION: Stable to recovery  FINDINGS (Full report in [facility]): 1. Successful coil embolization of a basilar apex aneurysm, There is residual aneurysm incorporating the origin of the left PCA. 2. 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

## 2021-02-22 NOTE — Anesthesia Procedure Notes (Signed)
Procedure Name: Intubation Date/Time: 02/22/2021 8:04 PM Performed by: Claudina Lick, CRNA Pre-anesthesia Checklist: Patient identified, Emergency Drugs available, Suction available and Patient being monitored Patient Re-evaluated:Patient Re-evaluated prior to induction Oxygen Delivery Method: Circle system utilized Preoxygenation: Pre-oxygenation with 100% oxygen Induction Type: IV induction Ventilation: Mask ventilation without difficulty Laryngoscope Size: Glidescope and 4 Grade View: Grade I Tube type: Oral Number of attempts: 2 (DL x 1 by Reinaldo Raddle with miller 2- esophageal intubation. Easy mask resumed. DL x 1 by CRNA with Glidescope- grade 1 view. Easy placement of ETT. Teeth as preop.) Airway Equipment and Method: Stylet and Oral airway Placement Confirmation: ETT inserted through vocal cords under direct vision, positive ETCO2 and breath sounds checked- equal and bilateral Tube secured with: Tape Dental Injury: Teeth and Oropharynx as per pre-operative assessment

## 2021-02-22 NOTE — Anesthesia Procedure Notes (Signed)
Arterial Line Insertion Start/End1/20/2023 6:15 PM, 02/22/2021 6:45 PM Performed by: Dairl Ponder, CRNA, CRNA  Patient location: Pre-op. Preanesthetic checklist: patient identified, IV checked, site marked, risks and benefits discussed, surgical consent, monitors and equipment checked, pre-op evaluation, timeout performed and anesthesia consent Lidocaine 1% used for infiltration Left, radial was placed Catheter size: 20 G Hand hygiene performed  and maximum sterile barriers used   Attempts: 1 Procedure performed without using ultrasound guided technique. Ultrasound Notes:anatomy identified, needle tip was noted to be adjacent to the nerve/plexus identified and no ultrasound evidence of intravascular and/or intraneural injection Following insertion, dressing applied and Biopatch. Post procedure assessment: normal and unchanged  Patient tolerated the procedure well with no immediate complications.

## 2021-02-23 ENCOUNTER — Inpatient Hospital Stay (HOSPITAL_COMMUNITY): Payer: 59

## 2021-02-23 DIAGNOSIS — I609 Nontraumatic subarachnoid hemorrhage, unspecified: Secondary | ICD-10-CM

## 2021-02-23 LAB — HIV ANTIBODY (ROUTINE TESTING W REFLEX): HIV Screen 4th Generation wRfx: NONREACTIVE

## 2021-02-23 LAB — MRSA NEXT GEN BY PCR, NASAL: MRSA by PCR Next Gen: NOT DETECTED

## 2021-02-23 NOTE — Progress Notes (Signed)
Patient ID: Tyler Mora, male   DOB: 1965/03/04, 56 y.o.   MRN: [id number] BP 131/64    Pulse 90    Temp 98.5 F (36.9 C) (Oral)    Resp (!) 23    Ht 6' (1.829 m)    Wt 81.6 kg    SpO2 97%    BMI 24.41 kg/m  Alert and oriented x 4, speech is clear and fluent Moving all extremities well  No drift Perrl, full EOM Tongue and uvula midling Doing very well, will discontinue arterial line

## 2021-02-23 NOTE — Progress Notes (Signed)
Transcranial Doppler  Date POD PCO2 HCT BP  MCA ACA PCA OPHT SIPH VERT Basilar  1/21 CK     Right  Left   55  31   -28  -16   32  27   37  33   *  *   -26  -16   -27           Right  Left                                            Right  Left                                             Right  Left                                             Right  Left                                            Right  Left                                            Right  Left                                        *unable to insonate [place] Ratio: R: 2.03  L:1.19   MCA = Middle Cerebral Artery      OPHT = Opthalmic Artery     BASILAR = Basilar Artery   ACA = Anterior Cerebral Artery     SIPH = Carotid Siphon PCA = Posterior Cerebral Artery   VERT = Verterbral Artery                   Normal MCA = 62+\-12 ACA = 50+\-12 PCA = 42+\-23

## 2021-02-23 NOTE — Progress Notes (Signed)
SLP Cancellation Note  Patient Details Name: Tyler Mora MRN: [id number] DOB: [birth year]   Cancelled treatment:       Reason Eval/Treat Not Completed: SLP screened, no needs identified, will sign off   Thorvald Orsino, Riley Nearing 02/23/2021, 9:15 AM

## 2021-02-23 NOTE — Evaluation (Signed)
Physical Therapy Evaluation & Discharge Patient Details Name: Tyler Mora MRN: [id number] DOB: 1965/08/08 Today's Date: 02/23/2021  History of Present Illness  56 y.o. male presented after sudden onset of severe headache earlier in the day.  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  PMH includes: Sickle cell trait, Tuberculosis.  Clinical Impression  Patient admitted with above diagnosis. Patient functioning at independent level with no AD. Patient able to negotiate 18 stairs independently with no handrail. Patient scored 24/24 on DGI indicative of low fall risk. Educated patient on s/s of stroke and importance of smoking cessation on stroke risk, patient verbalized understanding but not receptive. No skilled PT needs identified acutely. No PT follow up recommended at this time.        Recommendations for follow up therapy are one component of a multi-disciplinary discharge planning process, led by the attending physician.  Recommendations may be updated based on patient status, additional functional criteria and insurance authorization.  Follow Up Recommendations No PT follow up    Assistance Recommended at Discharge PRN  Patient can return home with the following       Equipment Recommendations None recommended by PT  Recommendations for Other Services       Functional Status Assessment Patient has not had a recent decline in their functional status     Precautions / Restrictions Precautions Precautions: Fall Precaution Comments: Impulsive Restrictions Weight Bearing Restrictions: No      Mobility  Bed Mobility Overal bed mobility: Independent                  Transfers Overall transfer level: Independent                      Ambulation/Gait Ambulation/Gait assistance: Independent Gait Distance (Feet): 450 Feet Assistive device: None Gait Pattern/deviations: WFL(Within Functional Limits)   Gait  velocity interpretation: >4.37 ft/sec, indicative of normal walking speed      Stairs Stairs: Yes Stairs assistance: Independent Stair Management: No rails, Alternating pattern, Forwards Number of Stairs: 18    Wheelchair Mobility    Modified Rankin (Stroke Patients Only)       Balance Overall balance assessment: Independent                               Standardized Balance Assessment Standardized Balance Assessment : Dynamic Gait Index   Dynamic Gait Index Level Surface: Normal Change in Gait Speed: Normal Gait with Horizontal Head Turns: Normal Gait with Vertical Head Turns: Normal Gait and Pivot Turn: Normal Step Over Obstacle: Normal Step Around Obstacles: Normal Steps: Normal Total Score: 24       Pertinent Vitals/Pain Pain Assessment Pain Assessment: No/denies pain    Home Living Family/patient expects to be discharged to:: Private residence Living Arrangements: Spouse/significant other;Children Available Help at Discharge: Family;Available PRN/intermittently Type of Home: House Home Access: Stairs to enter Entrance Stairs-Rails: Right;Left;Can reach both Entrance Stairs-Number of Steps: 7   Home Layout: One level Home Equipment: None      Prior Function Prior Level of Function : Independent/Modified Independent;Working/employed;Driving                     Hand Dominance   Dominant Hand: Right    Extremity/Trunk Assessment   Upper Extremity Assessment Upper Extremity Assessment: Defer to OT evaluation    Lower Extremity Assessment Lower Extremity Assessment: Overall Tyler Mora  for tasks assessed    Cervical / Trunk Assessment Cervical / Trunk Assessment: Normal  Communication   Communication: No difficulties  Cognition Arousal/Alertness: Awake/alert Behavior During Therapy: WFL for tasks assessed/performed Overall Cognitive Status: Within Functional Limits for tasks assessed                                           General Comments      Exercises     Assessment/Plan    PT Assessment Patient does not need any further PT services  PT Problem List         PT Treatment Interventions      PT Goals (Current goals can be found in the Care Plan section)  Acute Rehab PT Goals Patient Stated Goal: to go home PT Goal Formulation: All assessment and education complete, DC therapy    Frequency       Co-evaluation               AM-PAC PT "6 Clicks" Mobility  Outcome Measure Help needed turning from your back to your side while in a flat bed without using bedrails?: None Help needed moving from lying on your back to sitting on the side of a flat bed without using bedrails?: None Help needed moving to and from a bed to a chair (including a wheelchair)?: None Help needed standing up from a chair using your arms (e.g., wheelchair or bedside chair)?: None Help needed to walk in hospital room?: None Help needed climbing 3-5 steps with a railing? : None 6 Click Score: 24    End of Session   Activity Tolerance: Patient tolerated treatment well Patient left: in chair;with call bell/phone within reach;with family/visitor present Nurse Communication: Mobility status PT Visit Diagnosis: Muscle weakness (generalized) (M62.81)    Time: 0277-4128 PT Time Calculation (min) (ACUTE ONLY): 15 min   Charges:   PT Evaluation $PT Eval Low Complexity: 1 Low          Udell Mazzocco A. Dan Humphreys PT, DPT Acute Rehabilitation Services Pager [phone] Office [phone]   Viviann Spare 02/23/2021, 2:20 PM
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
## 2021-02-23 NOTE — Progress Notes (Signed)
Pacu RN Report to floor given  Gave report to  [facility] ICU RN. Room: 4N27.  Discussed surgery, meds given in OR and Pacu, VS, IV fluids given, EBL, urine output, pain and other pertinent information. Also discussed if pt had any family or friends here or belongings with them. Wife brought to 4N waiting area.  Discussed NIH, no deficits noted. No pain, headache is gone. Pt on Cleviprex at 8. All Neuro is intact.  Pt did state he was having some double vision on arrival to ICU when the bright light was turned on . Clovis Riley RN will do a full assessment and NIH. This was not noted in Pacu.   Pt exits my care.

## 2021-02-24 LAB — TRIGLYCERIDES: Triglycerides: 243 mg/dL — ABNORMAL HIGH (ref <150)

## 2021-02-24 MED ORDER — IBUPROFEN 200 MG PO TABS
600.0000 mg | ORAL_TABLET | Freq: Three times a day (TID) | ORAL | Status: DC | PRN
  Administered 2021-02-24 – 2021-02-26 (×6): 600 mg via ORAL
  Filled 2021-02-24 (×6): qty 3

## 2021-02-24 NOTE — Anesthesia Postprocedure Evaluation (Signed)
Anesthesia Post Note  Patient: Tyler Mora  Procedure(s) Performed: IR WITH ANESTHESIA     Patient location during evaluation: PACU Anesthesia Type: General Level of consciousness: awake and alert Pain management: pain level controlled Vital Signs Assessment: post-procedure vital signs reviewed and stable Respiratory status: spontaneous breathing, nonlabored ventilation, respiratory function stable and patient connected to nasal cannula oxygen Cardiovascular status: blood pressure returned to baseline and stable Postop Assessment: no apparent nausea or vomiting Anesthetic complications: no   No notable events documented.  Last Vitals:  Vitals:   02/24/21 1100 02/24/21 1200  BP: 119/77 (!) 147/82  Pulse: 66 68  Resp: 20 18  Temp:    SpO2: 94% 93%    Last Pain:  Vitals:   02/24/21 1157  TempSrc:   PainSc: 0-No pain                 Belenda Cruise P Reginald Weida

## 2021-02-24 NOTE — Progress Notes (Signed)
Patient ID: Tyler Mora, male   DOB: 10-27-1965, 56 y.o.   MRN: [id number] BP 119/77    Pulse 66    Temp 98.4 F (36.9 C) (Oral)    Resp 20    Ht 6' (1.829 m)    Wt 81.6 kg    SpO2 94%    BMI 24.41 kg/m  Alert and oriented x4 speech is clear and fluent Moving all extremities well Perrl, full eom No drift Normal strength in lower extremities Adding motrin for headaches

## 2021-02-24 NOTE — TOC Progression Note (Signed)
Transition of Care [facility]) - Progression Note    Patient Details  Name: Tyler Mora MRN: [id number] Date of Birth: [birth year]  Transition of Care [facility]) CM/SW Contact  Leone Haven, RN Phone Number: 02/24/2021, 1:24 PM  Clinical Narrative:     Transition of Care [facility]) Screening Note   Patient Details  Name: Tyler Mora Date of Birth: 1965/08/15   Transition of Care [facility]) CM/SW Contact:    Leone Haven, RN Phone Number: 02/24/2021, 1:25 PM    Transition of Care Department [facility]) has reviewed patient and no TOC needs have been identified at this time. We will continue to monitor patient advancement through interdisciplinary progression rounds. If new patient transition needs arise, please place a TOC consult.          Expected Discharge Plan and Services                                                 Social Determinants of Health (SDOH) Interventions    Readmission Risk Interventions No flowsheet data found.

## 2021-02-24 NOTE — Plan of Care (Signed)
°  Problem: Education: °Goal: Knowledge of disease or condition will improve °Outcome: Progressing °Goal: Knowledge of secondary prevention will improve (SELECT ALL) °Outcome: Progressing °Goal: Knowledge of patient specific risk factors will improve (INDIVIDUALIZE FOR PATIENT) °Outcome: Progressing °Goal: Individualized Educational Video(s) °Outcome: Progressing °  °Problem: Coping: °Goal: Will verbalize positive feelings about self °Outcome: Progressing °Goal: Will identify appropriate support needs °Outcome: Progressing °  °Problem: Health Behavior/Discharge Planning: °Goal: Ability to manage health-related needs will improve °Outcome: Progressing °  °Problem: Self-Care: °Goal: Ability to participate in self-care as condition permits will improve °Outcome: Progressing °Goal: Verbalization of feelings and concerns over difficulty with self-care will improve °Outcome: Progressing °Goal: Ability to communicate needs accurately will improve °Outcome: Progressing °  °Problem: Nutrition: °Goal: Risk of aspiration will decrease °Outcome: Progressing °Goal: Dietary intake will improve °Outcome: Progressing °  °Problem: Spontaneous Subarachnoid Hemorrhage Tissue Perfusion: °Goal: Complications of Spontaneous Subarachnoid Hemorrhage will be minimized °Outcome: Progressing °  °

## 2021-02-25 ENCOUNTER — Encounter (HOSPITAL_COMMUNITY): Payer: Self-pay | Admitting: Neurosurgery

## 2021-02-25 ENCOUNTER — Inpatient Hospital Stay (HOSPITAL_COMMUNITY): Payer: 59

## 2021-02-25 DIAGNOSIS — I609 Nontraumatic subarachnoid hemorrhage, unspecified: Secondary | ICD-10-CM

## 2021-02-25 NOTE — Plan of Care (Signed)
°  Problem: Education: Goal: Knowledge of disease or condition will improve Outcome: Progressing Goal: Knowledge of secondary prevention will improve (SELECT ALL) Outcome: Progressing Goal: Knowledge of patient specific risk factors will improve (INDIVIDUALIZE FOR PATIENT) Outcome: Progressing Goal: Individualized Educational Video(s) Outcome: Progressing   Problem: Coping: Goal: Will verbalize positive feelings about self Outcome: Progressing Goal: Will identify appropriate support needs Outcome: Progressing   Problem: Health Behavior/Discharge Planning: Goal: Ability to manage health-related needs will improve Outcome: Progressing   Problem: Self-Care: Goal: Ability to participate in self-care as condition permits will improve Outcome: Progressing Goal: Verbalization of feelings and concerns over difficulty with self-care will improve Outcome: Progressing Goal: Ability to communicate needs accurately will improve Outcome: Progressing   Problem: Nutrition: Goal: Risk of aspiration will decrease Outcome: Progressing Goal: Dietary intake will improve Outcome: Progressing   Problem: Spontaneous Subarachnoid Hemorrhage Tissue Perfusion: Goal: Complications of Spontaneous Subarachnoid Hemorrhage will be minimized Outcome: Progressing

## 2021-02-25 NOTE — Progress Notes (Signed)
Transcranial Doppler   Date POD PCO2 HCT BP   MCA ACA PCA OPHT SIPH VERT Basilar  1/21 CK         Right  Left   55  31   -28  -16   32  27   37  33   *  *   -26  -16   -27       1/23 RH          Right  Left    59   51    -30   -27    25   24    14   16     40   31    -27   -32    -44                 Right  Left                                                                 Right  Left                                                                 Right  Left                                                               Right  Left                                                               Right  Left                                                       *unable to insonate Lindegaard Ratio: R: 2.03  L:1.19     MCA = Middle Cerebral Artery      OPHT = Opthalmic Artery     BASILAR = Basilar Artery   ACA = Anterior Cerebral Artery     SIPH = Carotid Siphon PCA = Posterior Cerebral Artery   VERT = Verterbral Artery                    Normal MCA = 62+\-12 ACA = 50+\-12 PCA = 42+\-23     RT Lindegaard= 3.28   LT Lindegaard= 2.04  , RDMS, RVT

## 2021-02-26 ENCOUNTER — Other Ambulatory Visit (HOSPITAL_COMMUNITY): Payer: Self-pay

## 2021-02-26 MED ORDER — NIMODIPINE 30 MG PO CAPS
60.0000 mg | ORAL_CAPSULE | ORAL | 0 refills | Status: AC
  Filled 2021-02-26: qty 204, 17d supply, fill #0

## 2021-02-26 NOTE — TOC Benefit Eligibility Note (Signed)
Patient Product/process development scientist completed.    The patient is currently admitted and upon discharge could be taking nimodipine (Nimotop) 30 mg.  The current 30 day co-pay is, $9.00.   The patient is insured through [employer]     Roland Earl, CPhT Pharmacy Patient Advocate Specialist [facility] Patient Advocate Team Direct Number: [phone]  Fax: [phone]

## 2021-02-26 NOTE — Progress Notes (Signed)
°  NEUROSURGERY PROGRESS NOTE   Pt seen and examined. No issues overnight. Pt with minimal HA this pm. Ambulating well without assistance.  EXAM: Temp:  [98 F (36.7 C)-98.8 F (37.1 C)] 98.8 F (37.1 C) (01/24 0800) Pulse Rate:  [59-83] 69 (01/24 0800) Resp:  [11-25] 19 (01/24 0900) BP: (116-157)/(65-94) 136/79 (01/24 0900) SpO2:  [86 %-98 %] 94 % (01/24 0900) Intake/Output      01/23 0701 01/24 0700 01/24 0701 01/25 0700   P.O.     I.V. (mL/kg) 928.4 (11.4)    Total Intake(mL/kg) 928.4 (11.4)    Urine (mL/kg/hr) 2550 (1.3)    Stool 0    Total Output 2550    Net -1621.7         Stool Occurrence 1 x     Awake, alert, oriented Speech fluent CN intact MAE well  LABS: Lab Results  Component Value Date   CREATININE 0.93 02/22/2021   BUN 13 02/22/2021   NA 138 02/22/2021   K 4.0 02/22/2021   CL 102 02/22/2021   CO2 25 02/22/2021   Lab Results  Component Value Date   WBC 16.4 (H) 02/22/2021   HGB 14.7 02/22/2021   HCT 43.0 02/22/2021   MCV 97.7 02/22/2021   PLT 264 02/22/2021    TCD: Date POD PCO2 HCT BP   MCA ACA PCA OPHT SIPH VERT Basilar  1/21 CK         Right  Left   55  31   -28  -16   32  27   37  33   *  *   -26  -16   -27       1/23 RH          Right  Left    59   51    -30   -27    25   24    14   16     40   31    -27   -32    -44         IMPRESSION: - 56 y.o. male s/p rupture of basilar apex aneurysm, remains neurologically intact. 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 No signs of HCP.  PLAN: - Cont observation, can consider d/c home tomorrow if stable - Finish 21d Nimotop   58, MD [facility]

## 2021-02-27 ENCOUNTER — Encounter (HOSPITAL_COMMUNITY): Payer: Self-pay

## 2021-03-01 ENCOUNTER — Other Ambulatory Visit: Payer: Self-pay | Admitting: *Deleted

## 2021-03-01 NOTE — Patient Outreach (Signed)
[facility]) Care Management  03/01/2021  Tyler Mora 1965/11/20 [id number]   RED ON EMMI ALERT - Stroke Day # 1 Date: 1/26 Red Alert Reason: Not scheduled follow up appointment   Outreach attempt #1, successful.  Identity verified.  This care manager introduced self and stated purpose of call.  [facility] care management services explained.    Member was admitted to hospital for cerebral aneurysm requiring intervention.  State he is doing very well now since discharge, speaks on how his faith has helped in his recovery.  Does not have any lingering affects from diagnosis, PT/OT/ST not needed.  He is aware that he will need to follow up with neurosurgeon in the next 2 weeks, will call to schedule for himself.  He does have medication ordered (only on one med, Nimodipine) and taking as instructed.  He does still have intermittent headaches, taking excedrin migraine with relief.  Lives with wife who has been supportive.  Denies any urgent concerns, encouraged to contact this care manager with questions.    Plan: RN CM will send education regarding aneurysm recovery.  Will follow up within the next 3 weeks.  Valente David, RN, MSN, [place] Manager [phone]

## 2021-03-01 NOTE — Patient Outreach (Signed)
Received a red flag Emmi stroke notification for Mr. Hoge.  I have assigned Kemper Durie, RN to call for follow up and determine if there are any Case Management needs.    Iverson Alamin, Donivan Scull [facility] Care Management Assistant [facility] Care Management [phone]

## 2021-03-01 NOTE — Patient Instructions (Signed)
Visit Information  Thank you for taking time to visit with me today. Please don't hesitate to contact me if I can be of assistance to you before our next scheduled telephone appointment.  Following are the goals we discussed today:  Call Neurosurgery office to schedule follow up.  Contact information found on discharge paperwork, page number 2  Our next appointment is by telephone in the next 3 weeks  The patient verbalized understanding of instructions, educational materials, and care plan provided today and agreed to receive a mailed copy of patient instructions, educational materials, and care plan.   The patient has been provided with contact information for the care management team and has been advised to call with any health related questions or concerns.   Valente David, RN, MSN, [place] Manager [phone]

## 2021-03-04 NOTE — Discharge Summary (Signed)
Physician Discharge Summary  Patient ID: Tyler Mora MRN: [id number] DOB/AGE: Oct 15, 1965 56 y.o.  Admit date: 02/22/2021 Discharge date: 03/04/2021  Admission Diagnoses:  Ruptured basilar artery aneurysm  Discharge Diagnoses:  Same Principal Problem:   Cerebral aneurysm Active Problems:   Ruptured aneurysm of basilar artery [facility])   Discharged Condition: Stable  Hospital Course:  Tyler Mora is a 56 y.o. male initially presenting with sudden onset of headache.  Work-up in the emergency department revealed small amount of intraventricular hemorrhage with no significant subarachnoid hemorrhage.  CT angiogram confirmed the presence of a basilar apex aneurysm.  He underwent coil embolization without complication.  He was monitored in the intensive care unit for several days without any change in neurologic condition.  He was taking over-the-counter analgesics and not complaining of significant headache.  In the absence of any significant subarachnoid hemorrhage he was discharged home in stable condition.  Treatments: Surgery -coil embolization of basilar aneurysm  Discharge Exam: Blood pressure 133/78, pulse 67, temperature 98.3 F (36.8 C), temperature source Oral, resp. rate 17, height 6' (1.829 m), weight 81.6 kg, SpO2 97 %. Awake, alert, oriented Speech fluent, appropriate CN grossly intact 5/5 BUE/BLE Wound c/d/i  Disposition: Discharge disposition: 01-Home or Self Care       Discharge Instructions     Call MD for:  redness, tenderness, or signs of infection (pain, swelling, redness, odor or green/yellow discharge around incision site)   Complete by: As directed    Call MD for:  temperature >100.4   Complete by: As directed    Diet - low sodium heart healthy   Complete by: As directed    Discharge instructions   Complete by: As directed    Walk at home as much as possible, at least 4 times / day   Increase activity slowly   Complete by: As directed    Lifting  restrictions   Complete by: As directed    No lifting > 10 lbs   May shower / Bathe   Complete by: As directed    48 hours after surgery   May walk up steps   Complete by: As directed    No dressing needed   Complete by: As directed    Other Restrictions   Complete by: As directed    No bending/twisting at waist      Allergies as of 02/26/2021       Reactions   Iodine Hives   Penicillins Hives        Medication List     STOP taking these medications    doxycycline 100 MG capsule Commonly known as: VIBRAMYCIN       TAKE these medications    niMODipine 30 MG capsule Commonly known as: NIMOTOP Take 2 capsules (60 mg total) by mouth every 4 (four) hours for 17 days.               Discharge Care Instructions  (From admission, onward)           Start     Ordered   02/26/21 0000  No dressing needed        02/26/21 1417            Follow-up Information     Lisbeth Renshaw, MD Follow up in 2 week(s).   Specialty: Neurosurgery Contact information: [address] [place] [address] [phone]                 Signed: Nolon Lennert  Kekai Geter 03/04/2021, 9:33 AM

## 2021-03-13 ENCOUNTER — Other Ambulatory Visit: Payer: Self-pay | Admitting: *Deleted

## 2021-03-13 NOTE — Patient Outreach (Signed)
[facility]) Care Management  03/13/2021  Raemond Meinecke 1965/06/10 [id number]   RED ON EMMI ALERT - Stroke Day # 13 Date: 2/7 Red Alert Reason: Been to follow up appointment?  NO   Outreach attempt #1, successful.    Report he had appointment with neurosurgeon today but was late due to car trouble.  Office visit was rescheduled for tomorrow morning. State he has continued to improve, minimal headaches and blurry vision much better.     Plan: RN CM will close case at this time as member denies any further needs.  Encouraged to contact this care manager with questions.    Valente David, RN, MSN, [place] Manager [phone]

## 2021-03-20 ENCOUNTER — Ambulatory Visit: Payer: 59 | Admitting: *Deleted

## 2021-07-25 ENCOUNTER — Other Ambulatory Visit: Payer: Self-pay | Admitting: Neurosurgery

## 2021-07-25 DIAGNOSIS — I609 Nontraumatic subarachnoid hemorrhage, unspecified: Secondary | ICD-10-CM

## 2021-08-13 ENCOUNTER — Ambulatory Visit (HOSPITAL_COMMUNITY)
Admission: RE | Admit: 2021-08-13 | Discharge: 2021-08-13 | Disposition: A | Discharge status: Home / Self Care | Payer: 59 | Source: Ambulatory Visit | Attending: Neurosurgery | Admitting: Neurosurgery

## 2021-08-13 ENCOUNTER — Other Ambulatory Visit: Payer: Self-pay | Admitting: Neurosurgery

## 2021-08-13 ENCOUNTER — Other Ambulatory Visit: Payer: Self-pay

## 2021-08-13 DIAGNOSIS — I609 Nontraumatic subarachnoid hemorrhage, unspecified: Secondary | ICD-10-CM | POA: Insufficient documentation

## 2021-08-13 DIAGNOSIS — F17210 Nicotine dependence, cigarettes, uncomplicated: Secondary | ICD-10-CM | POA: Diagnosis not present

## 2021-08-13 HISTORY — PX: IR ANGIO VERTEBRAL SEL VERTEBRAL UNI R MOD SED: IMG5368

## 2021-08-13 HISTORY — PX: IR 3D INDEPENDENT WKST: IMG2385

## 2021-08-13 HISTORY — PX: IR US GUIDE VASC ACCESS RIGHT: IMG2390

## 2021-08-13 LAB — CBC WITH DIFFERENTIAL/PLATELET
Abs Immature Granulocytes: 0.04 10*3/uL (ref 0.00–0.07)
Basophils Absolute: 0.1 10*3/uL (ref 0.0–0.1)
Basophils Relative: 1 %
Eosinophils Absolute: 0.6 10*3/uL — ABNORMAL HIGH (ref 0.0–0.5)
Eosinophils Relative: 5 %
HCT: 42.1 % (ref 39.0–52.0)
Hemoglobin: 14.8 g/dL (ref 13.0–17.0)
Immature Granulocytes: 0 %
Lymphocytes Relative: 27 %
Lymphs Abs: 3 10*3/uL (ref 0.7–4.0)
MCH: 33.8 pg (ref 26.0–34.0)
MCHC: 35.2 g/dL (ref 30.0–36.0)
MCV: 96.1 fL (ref 80.0–100.0)
Monocytes Absolute: 0.9 10*3/uL (ref 0.1–1.0)
Monocytes Relative: 8 %
Neutro Abs: 6.5 10*3/uL (ref 1.7–7.7)
Neutrophils Relative %: 59 %
Platelets: 265 10*3/uL (ref 150–400)
RBC: 4.38 MIL/uL (ref 4.22–5.81)
RDW: 12.4 % (ref 11.5–15.5)
WBC: 11.1 10*3/uL — ABNORMAL HIGH (ref 4.0–10.5)
nRBC: 0 % (ref 0.0–0.2)

## 2021-08-13 MED ORDER — NITROGLYCERIN 1 MG/10 ML FOR IR/CATH LAB
INTRA_ARTERIAL | Status: AC
  Filled 2021-08-13: qty 10

## 2021-08-13 MED ORDER — HEPARIN SODIUM (PORCINE) 1000 UNIT/ML IJ SOLN
INTRAMUSCULAR | Status: AC
  Filled 2021-08-13: qty 10

## 2021-08-13 MED ORDER — MIDAZOLAM HCL 2 MG/2ML IJ SOLN
INTRAMUSCULAR | Status: AC | PRN
  Administered 2021-08-13: 1 mg via INTRAVENOUS

## 2021-08-13 MED ORDER — HYDROCODONE-ACETAMINOPHEN 5-325 MG PO TABS
1.0000 | ORAL_TABLET | ORAL | Status: DC | PRN
  Administered 2021-08-13: 2 via ORAL

## 2021-08-13 MED ORDER — HYDROCODONE-ACETAMINOPHEN 5-325 MG PO TABS
ORAL_TABLET | ORAL | Status: AC
  Filled 2021-08-13: qty 2

## 2021-08-13 MED ORDER — MIDAZOLAM HCL 2 MG/2ML IJ SOLN
INTRAMUSCULAR | Status: AC
  Filled 2021-08-13: qty 2

## 2021-08-13 MED ORDER — SODIUM CHLORIDE 0.9 % IV SOLN
INTRAVENOUS | Status: DC
Start: 2021-08-13 — End: 2021-08-14

## 2021-08-13 MED ORDER — VERAPAMIL HCL 2.5 MG/ML IV SOLN
INTRAVENOUS | Status: AC
  Filled 2021-08-13: qty 2

## 2021-08-13 MED ORDER — VANCOMYCIN HCL IN DEXTROSE 1-5 GM/200ML-% IV SOLN: 1000.0000 mg | INTRAVENOUS | Status: DC

## 2021-08-13 MED ORDER — IOHEXOL 300 MG/ML  SOLN
100.0000 mL | Freq: Once | INTRAMUSCULAR | Status: AC | PRN
  Administered 2021-08-13: 30 mL via INTRA_ARTERIAL

## 2021-08-13 MED ORDER — FENTANYL CITRATE (PF) 100 MCG/2ML IJ SOLN
INTRAMUSCULAR | Status: AC | PRN
  Administered 2021-08-13: 25 ug via INTRAVENOUS

## 2021-08-13 MED ORDER — HEPARIN SODIUM (PORCINE) 1000 UNIT/ML IJ SOLN
INTRAMUSCULAR | Status: AC | PRN
  Administered 2021-08-13: 3000 [IU] via INTRAVENOUS

## 2021-08-13 MED ORDER — FENTANYL CITRATE (PF) 100 MCG/2ML IJ SOLN
INTRAMUSCULAR | Status: AC
  Filled 2021-08-13: qty 2

## 2021-08-13 MED ORDER — LIDOCAINE HCL 1 % IJ SOLN
INTRAMUSCULAR | Status: AC
  Filled 2021-08-13: qty 20

## 2021-08-13 NOTE — Progress Notes (Signed)
Spoke with Dr Conchita Paris pt does not need to be pre medicated for contrast allergy, pt is allergic to scrub iodine. Has not been pre medicated prior per Dr.

## 2021-08-13 NOTE — Progress Notes (Signed)
Notified Dr. Conchita Paris of patient stating he has bad headache on and off since being given vicodin. When hurts its 8/10. Also per Dr. Conchita Paris patient was okay to return to work on Monday.

## 2021-08-13 NOTE — Brief Op Note (Signed)
  NEUROSURGERY BRIEF OPERATIVE  NOTE   PREOP DX: Subarachnoid Hemorrhage  POSTOP DX: Same  PROCEDURE: Diagnostic cerebral angiogram  SURGEON: Dr. Lisbeth Renshaw, MD  ANESTHESIA: IV Sedation with Local  APPROACH: Right trans-radial  EBL: Minimal  SPECIMENS: None  COMPLICATIONS: None  CONDITION: Stable to recovery  FINDINGS (Full report in [facility]): 1. 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   Lisbeth Renshaw, MD [facility]

## 2021-08-13 NOTE — H&P (Signed)
  Chief Complaint  Subarachnoid Hemorrhage  History of Present Illness  Tyler Mora is a 56 y.o. male with a history of subarachnoid hemorrhage approximately 6 months ago at which time he underwent coil embolization of a basilar apex aneurysm.  Patient has made an excellent neurologic recovery.  He does report some transient episodes of visual obscurations lasting a few seconds at a time but is otherwise well.  He presents today for routine short-term follow-up diagnostic cerebral angiogram.  Past Medical History   Past Medical History:  Diagnosis Date   Sickle cell trait (HCC)    Tuberculosis    Pt reports being exposed    Past Surgical History   Past Surgical History:  Procedure Laterality Date   APPENDECTOMY     IR ANGIO INTRA EXTRACRAN SEL INTERNAL CAROTID BILAT MOD SED  02/22/2021   IR ANGIOGRAM FOLLOW UP STUDY  02/22/2021   IR ANGIOGRAM FOLLOW UP STUDY  02/22/2021   IR ANGIOGRAM FOLLOW UP STUDY  02/22/2021   IR TRANSCATH/EMBOLIZ  02/22/2021   MASS EXCISION Left    Chest area   RADIOLOGY WITH ANESTHESIA N/A 02/22/2021   Procedure: IR WITH ANESTHESIA;  Surgeon: Lisbeth Renshaw, MD;  Location: [facility] OR;  Service: Radiology;  Laterality: N/A;    Social History   Social History   Tobacco Use   Smoking status: Every Day    Packs/day: 0.25    Types: Cigarettes   Smokeless tobacco: Never  Vaping Use   Vaping Use: Never used  Substance Use Topics   Alcohol use: Yes    Comment: occ   Drug use: No    Medications   Prior to Admission medications   Medication Sig Start Date End Date Taking? Authorizing Provider  niMODipine (NIMOTOP) 30 MG capsule Take 2 capsules (60 mg total) by mouth every 4 (four) hours for 17 days. 02/26/21 03/15/21  Lisbeth Renshaw, MD    Allergies   Allergies  Allergen Reactions   Iodine Hives   Penicillins Hives   Shellfish Allergy Hives    Pt eats shellfish but if he accidentally gets a piece of the shell he gets hives    Review of  Systems  ROS  Neurologic Exam  Awake, alert, oriented Memory and concentration grossly intact Speech fluent, appropriate CN grossly intact Motor exam: Upper Extremities Deltoid Bicep Tricep Grip  Right 5/5 5/5 5/5 5/5  Left 5/5 5/5 5/5 5/5   Lower Extremities IP Quad PF DF EHL  Right 5/5 5/5 5/5 5/5 5/5  Left 5/5 5/5 5/5 5/5 5/5   Sensation grossly intact to LT  Impression  - 56 y.o. male 104-month status post subarachnoid hemorrhage and coil embolization of a large basilar apex aneurysm.  Patient is neurologically well.  Plan  -We will proceed with routine short-term angiographic follow-up  I have reviewed the details of the procedure as well as the expected postoperative course and recovery with the patient at length in the office.  We have also discussed the associated risks, benefits, and alternatives to the procedure.  All his questions today were answered and he provided informed consent to proceed.   Lisbeth Renshaw, MD [facility]

## 2021-10-22 ENCOUNTER — Other Ambulatory Visit (HOSPITAL_COMMUNITY): Payer: Self-pay

## 2022-12-03 ENCOUNTER — Other Ambulatory Visit (HOSPITAL_COMMUNITY): Payer: Self-pay | Admitting: Family Medicine

## 2022-12-03 DIAGNOSIS — I671 Cerebral aneurysm, nonruptured: Secondary | ICD-10-CM

## 2022-12-11 ENCOUNTER — Ambulatory Visit (HOSPITAL_COMMUNITY): Admission: RE | Admit: 2022-12-11 | Payer: 59 | Source: Ambulatory Visit

## 2022-12-11 ENCOUNTER — Encounter (HOSPITAL_COMMUNITY): Payer: Self-pay

## 2023-02-09 IMAGING — MR MR HEAD WO/W CM
13 of 15 series · 38 of 48 positions shown · IV contrast (8 ml Gadavist)
Comparison: CT head 02/22/2021

CLINICAL DATA: Acute onset headache. Probable intracranial
hemorrhage on CT.

EXAM:
MRI HEAD WITHOUT AND WITH CONTRAST
TECHNIQUE: Multiplanar, multiecho pulse sequences of the brain and surrounding
structures were obtained without and with intravenous contrast.
CONTRAST:  8mL GADAVIST GADOBUTROL 1 MMOL/ML IV SOLN

[Series 5: DWI · axial · 4.0mm · 0.88mm/px · z∈[-74,+73]mm · 3 of 38 slices shown (1 of 4)]
[im 1/38]
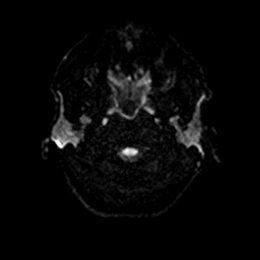
[im 19/38]
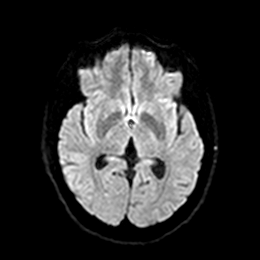
[im 38/38]
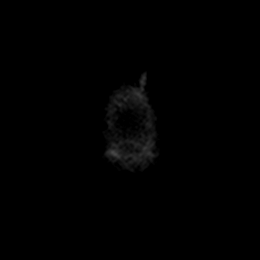

[Series 6: DWI · axial · 4.0mm · 0.88mm/px · z∈[-74,+73]mm · 3 of 38 slices shown (2 of 4)]
[im 1/38]
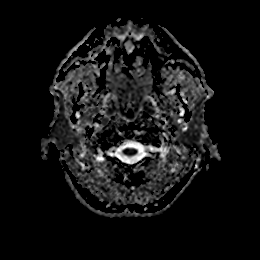
[im 19/38]
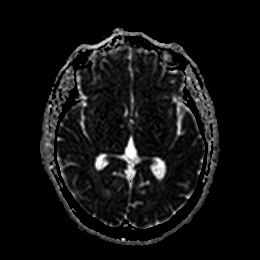
[im 38/38]
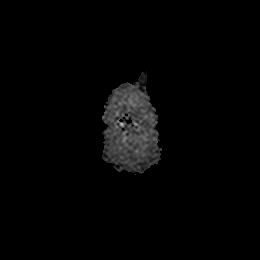

[Series 7: DWI · coronal · 5.0mm · 0.88mm/px · 3 of 32 slices shown (3 of 4)]
[im 1/32]
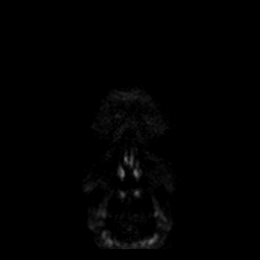
[im 16/32]
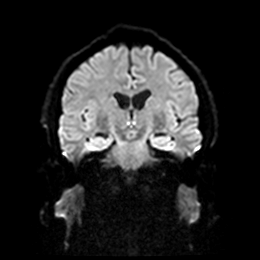
[im 32/32]
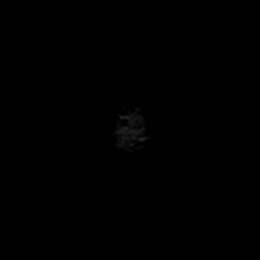

[Series 8: DWI · coronal · 5.0mm · 0.88mm/px · 3 of 32 slices shown (4 of 4)]
[im 1/32]
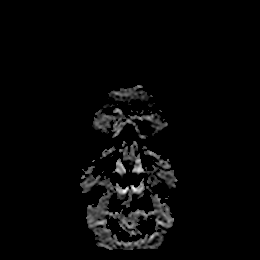
[im 16/32]
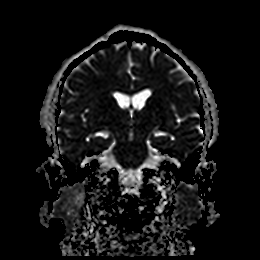
[im 32/32]
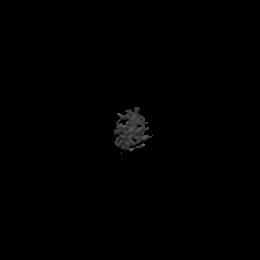

[Series 9: T1 · sagittal · 5.0mm · 0.75mm/px · 2 of 21 slices shown (1 of 2)]
[im 1/21]
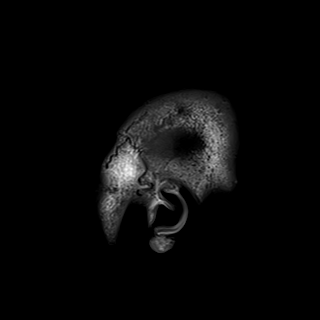
[im 21/21]
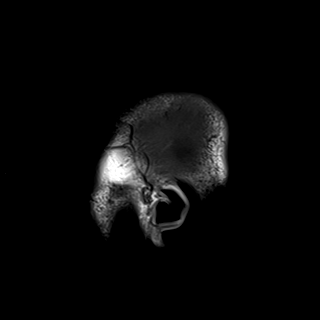

[Series 10: T2 · axial · 5.0mm · 0.72mm/px · z∈[-73,+73]mm · 2 of 22 slices shown]
[im 1/22]
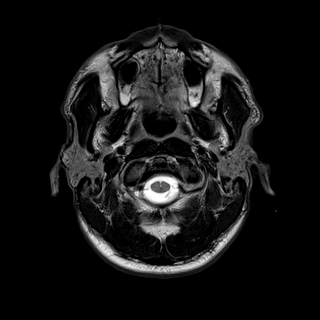
[im 22/22]
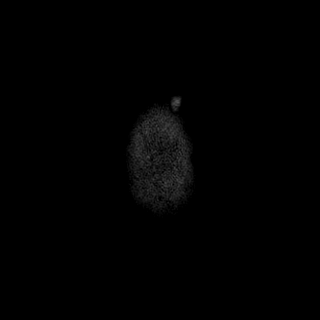

[Series 11: mag_images · axial · 3.0mm · 0.90mm/px · z∈[-77,+75]mm · 4 of 52 slices shown]
[im 1/52]
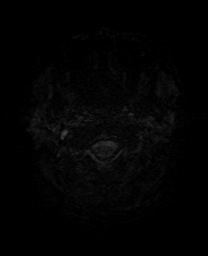
[im 18/52]
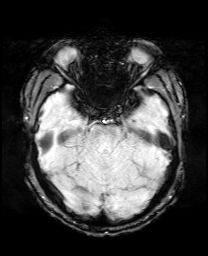
[im 35/52]
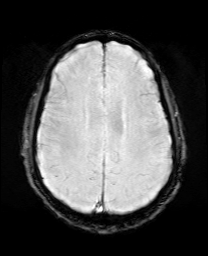
[im 52/52]
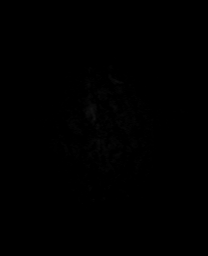

[Series 12: pha_images · axial · 3.0mm · 0.90mm/px · z∈[-77,+75]mm · 4 of 52 slices shown]
[im 1/52]
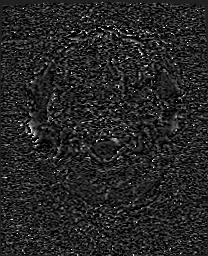
[im 18/52]
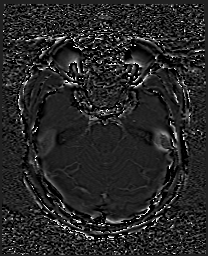
[im 35/52]
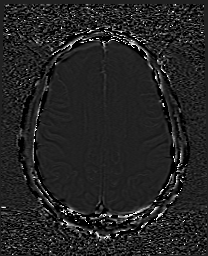
[im 52/52]
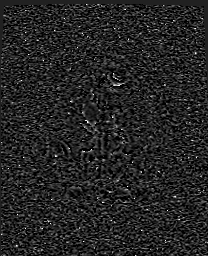

[Series 13: swi_images · axial · 3.0mm · 0.90mm/px · z∈[-77,+75]mm · 4 of 52 slices shown]
[im 1/52]
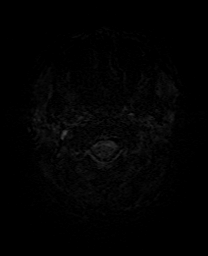
[im 18/52]
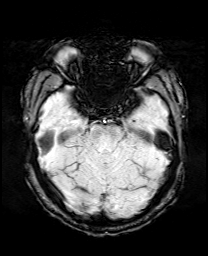
[im 35/52]
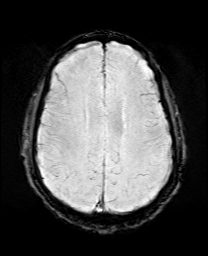
[im 52/52]
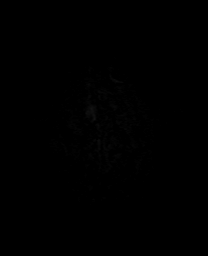

[Series 15: FLAIR · axial · 4.0mm · 0.43mm/px · z∈[-74,+73]mm · 3 of 38 slices shown]
[im 1/38]
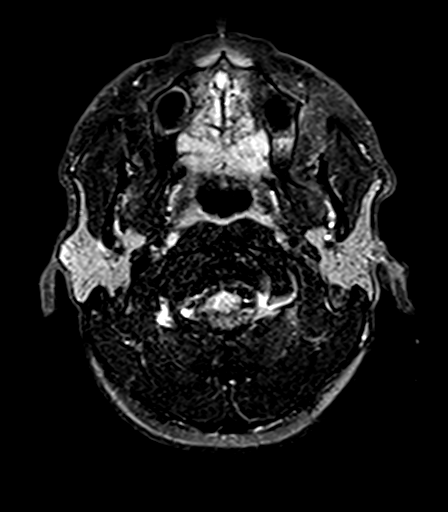
[im 19/38]
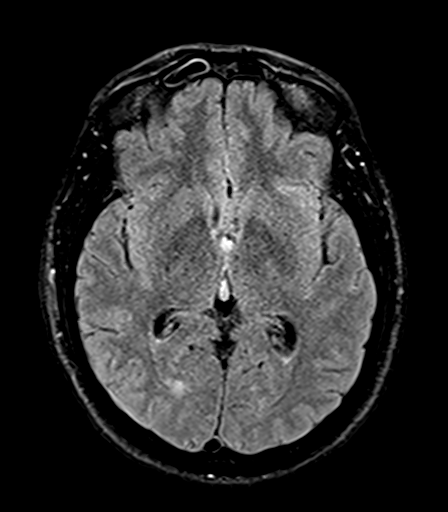
[im 38/38]
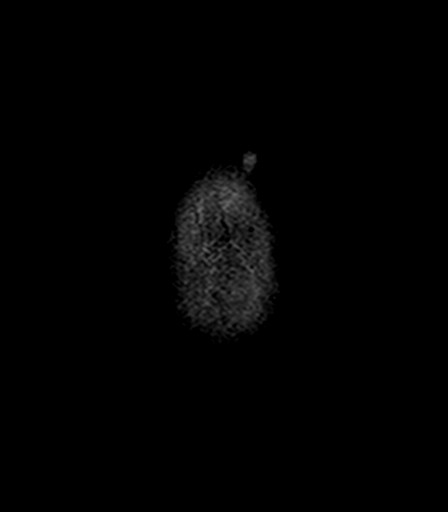

[Series 17: T2 post-contrast · coronal · 5.0mm · 0.69mm/px · 3 of 32 slices shown]
[im 1/32]
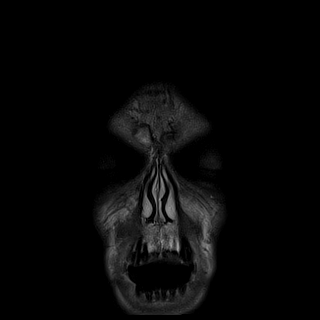
[im 16/32]
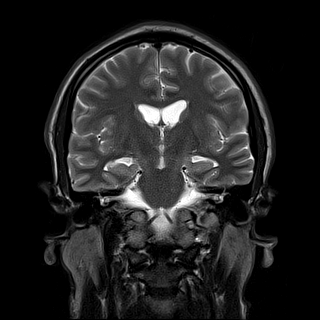
[im 32/32]
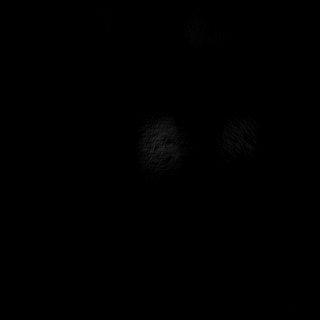

[Series 19: T1 post-contrast · coronal · 5.0mm · 0.34mm/px · 2 of 28 slices shown]
[im 1/28]
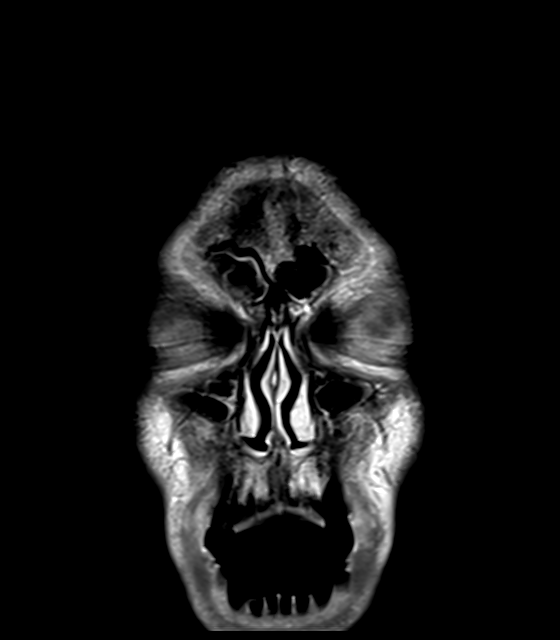
[im 28/28]
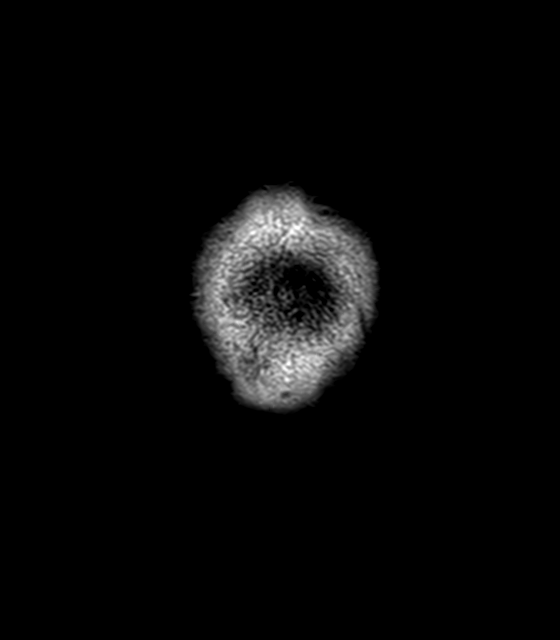

[Series 20: T1 · sagittal · 5.0mm · 0.90mm/px · 2 of 23 slices shown (2 of 2)]
[im 1/23]
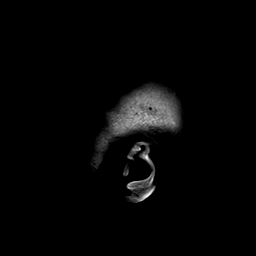
[im 23/23]
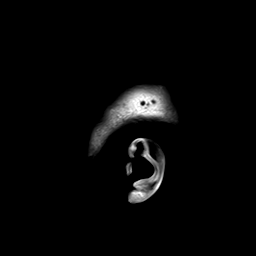

[38 of 48 positions shown; findings below may reference images not displayed]

FINDINGS: Brain: Hemorrhage is present in the third ventricle. Small amount of
hemorrhage in the occipital horns bilaterally. Otherwise no
subarachnoid hemorrhage elsewhere.

Mild prominence of the temporal horns. Lateral ventricles and third
ventricle normal in size.

Negative for acute infarct or mass lesion.

Vascular: Probable aneurysm of the terminal basilar. See sagittal
image 11. This is likely the cause of the intraventricular
hemorrhage. Normal arterial flow voids otherwise.

Skull and upper cervical spine: No focal skeletal lesion. Multiple
scalp nodules. Possible Maag cysts. Recommend physical exam.

Sinuses/Orbits: Mucosal edema paranasal sinuses.  Negative orbit

Other: None
IMPRESSION: Acute intraventricular hemorrhages confirmed on MRI. There is a
probable 5 mm aneurysm of the terminal basilar which is the cause of
the hemorrhage. Recommend CT angio head.

These results were called by telephone at the time of interpretation
on 02/22/2021 at [DATE] to provider SELLAB ZUINA , who verbally
acknowledged these results.

## 2023-02-21 ENCOUNTER — Ambulatory Visit (HOSPITAL_COMMUNITY): Admission: RE | Admit: 2023-02-21 | Payer: 59 | Source: Ambulatory Visit

## 2023-02-23 ENCOUNTER — Ambulatory Visit (HOSPITAL_COMMUNITY): Admission: RE | Admit: 2023-02-23 | Payer: 59 | Source: Ambulatory Visit

## 2023-02-26 ENCOUNTER — Ambulatory Visit (HOSPITAL_COMMUNITY)
Admission: RE | Admit: 2023-02-26 | Discharge: 2023-02-26 | Disposition: A | Discharge status: Home / Self Care | Payer: 59 | Source: Ambulatory Visit | Attending: Family Medicine | Admitting: Family Medicine

## 2023-02-26 ENCOUNTER — Encounter (HOSPITAL_COMMUNITY): Payer: Self-pay

## 2023-02-26 DIAGNOSIS — I671 Cerebral aneurysm, nonruptured: Secondary | ICD-10-CM | POA: Insufficient documentation
